# Patient Record
Sex: Female | Born: 1950 | Race: Asian | Hispanic: No | Smoking: Never smoker
Health system: Southern US, Community
[De-identification: ages and names within clinical notes are randomized; demographics above are authoritative.]

## PROBLEM LIST (undated history)

## (undated) DIAGNOSIS — C801 Malignant (primary) neoplasm, unspecified: Secondary | ICD-10-CM

## (undated) DIAGNOSIS — Z87442 Personal history of urinary calculi: Secondary | ICD-10-CM

## (undated) DIAGNOSIS — Z973 Presence of spectacles and contact lenses: Secondary | ICD-10-CM

## (undated) DIAGNOSIS — C50919 Malignant neoplasm of unspecified site of unspecified female breast: Secondary | ICD-10-CM

## (undated) DIAGNOSIS — K5909 Other constipation: Secondary | ICD-10-CM

## (undated) DIAGNOSIS — N201 Calculus of ureter: Secondary | ICD-10-CM

## (undated) DIAGNOSIS — E785 Hyperlipidemia, unspecified: Secondary | ICD-10-CM

## (undated) DIAGNOSIS — Z8619 Personal history of other infectious and parasitic diseases: Secondary | ICD-10-CM

## (undated) DIAGNOSIS — K7460 Unspecified cirrhosis of liver: Secondary | ICD-10-CM

## (undated) DIAGNOSIS — I10 Essential (primary) hypertension: Secondary | ICD-10-CM

## (undated) HISTORY — DX: Hyperlipidemia, unspecified: E78.5

## (undated) HISTORY — DX: Essential (primary) hypertension: I10

## (undated) HISTORY — PX: PARTIAL HYSTERECTOMY: SHX80

## (undated) HISTORY — DX: Other constipation: K59.09

---

## 2004-12-28 ENCOUNTER — Ambulatory Visit: Payer: Self-pay | Admitting: Internal Medicine

## 2005-02-04 ENCOUNTER — Ambulatory Visit: Payer: Self-pay | Admitting: *Deleted

## 2005-03-19 ENCOUNTER — Ambulatory Visit: Payer: Self-pay | Admitting: Internal Medicine

## 2005-05-24 ENCOUNTER — Ambulatory Visit: Payer: Self-pay | Admitting: Internal Medicine

## 2017-03-28 ENCOUNTER — Emergency Department (HOSPITAL_COMMUNITY)
Admission: EM | Admit: 2017-03-28 | Discharge: 2017-03-28 | Disposition: A | Discharge status: Home / Self Care | Payer: Self-pay | Attending: Emergency Medicine | Admitting: Emergency Medicine

## 2017-03-28 ENCOUNTER — Encounter (HOSPITAL_COMMUNITY): Payer: Self-pay | Admitting: Nurse Practitioner

## 2017-03-28 ENCOUNTER — Emergency Department (HOSPITAL_COMMUNITY): Payer: Self-pay

## 2017-03-28 DIAGNOSIS — N23 Unspecified renal colic: Secondary | ICD-10-CM | POA: Insufficient documentation

## 2017-03-28 DIAGNOSIS — N201 Calculus of ureter: Secondary | ICD-10-CM | POA: Insufficient documentation

## 2017-03-28 DIAGNOSIS — Z79899 Other long term (current) drug therapy: Secondary | ICD-10-CM | POA: Insufficient documentation

## 2017-03-28 LAB — URINALYSIS, ROUTINE W REFLEX MICROSCOPIC
Bilirubin Urine: NEGATIVE
Glucose, UA: NEGATIVE mg/dL
Ketones, ur: NEGATIVE mg/dL
NITRITE: NEGATIVE
PH: 7 (ref 5.0–8.0)
Protein, ur: NEGATIVE mg/dL
SPECIFIC GRAVITY, URINE: 1.012 (ref 1.005–1.030)

## 2017-03-28 LAB — COMPREHENSIVE METABOLIC PANEL
ALT: 18 U/L (ref 14–54)
AST: 27 U/L (ref 15–41)
Albumin: 3.8 g/dL (ref 3.5–5.0)
Alkaline Phosphatase: 70 U/L (ref 38–126)
Anion gap: 8 (ref 5–15)
BILIRUBIN TOTAL: 1.1 mg/dL (ref 0.3–1.2)
BUN: 14 mg/dL (ref 6–20)
CALCIUM: 9 mg/dL (ref 8.9–10.3)
CO2: 25 mmol/L (ref 22–32)
CREATININE: 0.97 mg/dL (ref 0.44–1.00)
Chloride: 107 mmol/L (ref 101–111)
Glucose, Bld: 123 mg/dL — ABNORMAL HIGH (ref 65–99)
Potassium: 3.1 mmol/L — ABNORMAL LOW (ref 3.5–5.1)
Sodium: 140 mmol/L (ref 135–145)
TOTAL PROTEIN: 7.1 g/dL (ref 6.5–8.1)

## 2017-03-28 LAB — CBC
HCT: 39.4 % (ref 36.0–46.0)
Hemoglobin: 13 g/dL (ref 12.0–15.0)
MCH: 27.5 pg (ref 26.0–34.0)
MCHC: 33 g/dL (ref 30.0–36.0)
MCV: 83.5 fL (ref 78.0–100.0)
PLATELETS: 162 10*3/uL (ref 150–400)
RBC: 4.72 MIL/uL (ref 3.87–5.11)
RDW: 13.1 % (ref 11.5–15.5)
WBC: 9.9 10*3/uL (ref 4.0–10.5)

## 2017-03-28 MED ORDER — ONDANSETRON HCL 4 MG/2ML IJ SOLN: 4.0000 mg | Freq: Once | INTRAMUSCULAR | Status: DC

## 2017-03-28 MED ORDER — POTASSIUM CHLORIDE CRYS ER 20 MEQ PO TBCR
40.0000 meq | EXTENDED_RELEASE_TABLET | Freq: Once | ORAL | Status: AC
  Administered 2017-03-28: 40 meq via ORAL
  Filled 2017-03-28: qty 2

## 2017-03-28 MED ORDER — OXYCODONE-ACETAMINOPHEN 5-325 MG PO TABS
2.0000 | ORAL_TABLET | Freq: Once | ORAL | Status: AC
  Administered 2017-03-28: 2 via ORAL
  Filled 2017-03-28: qty 2

## 2017-03-28 MED ORDER — KETOROLAC TROMETHAMINE 60 MG/2ML IM SOLN
30.0000 mg | Freq: Once | INTRAMUSCULAR | Status: AC
  Administered 2017-03-28: 30 mg via INTRAMUSCULAR
  Filled 2017-03-28: qty 2

## 2017-03-28 MED ORDER — ONDANSETRON 8 MG PO TBDP
8.0000 mg | ORAL_TABLET | Freq: Once | ORAL | Status: AC
  Administered 2017-03-28: 8 mg via ORAL
  Filled 2017-03-28: qty 1

## 2017-03-28 MED ORDER — PROMETHAZINE HCL 25 MG PO TABS: 25.0000 mg | ORAL_TABLET | Freq: Four times a day (QID) | ORAL | 0 refills | Status: DC | PRN

## 2017-03-28 MED ORDER — TAMSULOSIN HCL 0.4 MG PO CAPS: 0.4000 mg | ORAL_CAPSULE | Freq: Every day | ORAL | 0 refills | Status: DC

## 2017-03-28 MED ORDER — OXYCODONE-ACETAMINOPHEN 5-325 MG PO TABS: 1.0000 | ORAL_TABLET | Freq: Four times a day (QID) | ORAL | 0 refills | Status: DC | PRN

## 2017-03-28 MED ORDER — SODIUM CHLORIDE 0.9 % IV BOLUS (SEPSIS): 1000.0000 mL | Freq: Once | INTRAVENOUS | Status: DC

## 2017-03-28 MED ORDER — IBUPROFEN 600 MG PO TABS: 600.0000 mg | ORAL_TABLET | Freq: Four times a day (QID) | ORAL | 0 refills | Status: DC | PRN

## 2017-03-28 NOTE — Discharge Instructions (Signed)
We recommend that you take Flomax as prescribed to promote stone movement. You may take ibuprofen for pain and Phenergan for nausea/vomiting. For severe pain, you may take Percocet as prescribed. Follow-up with a urologist to ensure complete passage of your kidney stone. You may return for new or concerning symptoms.

## 2017-03-28 NOTE — ED Provider Notes (Signed)
[facility] DEPT Provider Note   CSN: [id number] Arrival date & time: 03/28/17  0002 By signing my name below, I, Fabian Sharp, attest that this documentation has been prepared under the direction and in the presence of non-physician provider, Antonietta Breach, PA-C. Electronically Signed: Fabian Sharp, ED Scribe. 03/27/2017. 1:20 AM.   History   Chief Complaint Chief Complaint  Patient presents with  . Flank Pain  . Probable Kidney Stone  . Dysuria   HPI Wendy Walsh is a 66 y.o. female who presents to the Emergency Department complaining of constant left sided flank pain that radiates to her LLQ onset today at 3 pm. She reports associated chills, nausea and vomiting 1x. Pt reports that pain is similar to prior kidney stone.  No treatments tried to alleviate pain. She reports SHx hysterectomy. Pt denies hematuria, dysuria, and fever.  The history is provided by a relative and the patient. A language interpreter was used.   Past Medical History:  Diagnosis Date  . Kidney stones     There are no active problems to display for this patient.   History reviewed. No pertinent surgical history.  OB History    No data available       Home Medications    Prior to Admission medications   Medication Sig Start Date End Date Taking? Authorizing Provider  amLODipine (NORVASC) 5 MG tablet Take 5 mg by mouth daily.   Yes [provider]  ibuprofen (ADVIL,MOTRIN) 600 MG tablet Take 1 tablet (600 mg total) by mouth every 6 (six) hours as needed. 03/28/17   Antonietta Breach, PA-C  oxyCODONE-acetaminophen (PERCOCET/ROXICET) 5-325 MG tablet Take 1-2 tablets by mouth every 6 (six) hours as needed for severe pain. 03/28/17   Antonietta Breach, PA-C  promethazine (PHENERGAN) 25 MG tablet Take 1 tablet (25 mg total) by mouth every 6 (six) hours as needed for nausea or vomiting. 03/28/17   Antonietta Breach, PA-C  tamsulosin (FLOMAX) 0.4 MG CAPS capsule Take 1 capsule (0.4 mg total) by mouth daily.  03/28/17   Antonietta Breach, PA-C    Family History History reviewed. No pertinent family history.  Social History Social History  Substance Use Topics  . Smoking status: Never Smoker  . Smokeless tobacco: Never Used  . Alcohol use No     Allergies   Patient has no known allergies.   Review of Systems Review of Systems All systems reviewed and are negative for acute change except as noted in the HPI.  Physical Exam Updated Vital Signs BP 124/72 (BP Location: Right Arm)   Pulse 68   Temp 99 F (37.2 C) (Oral)   Resp 18   SpO2 100%   Physical Exam  Constitutional: She is oriented to person, place, and time. She appears well-developed and well-nourished. No distress.  Nontoxic appearing. Calm, pleasant.  HENT:  Head: Normocephalic and atraumatic.  Eyes: Conjunctivae and EOM are normal. No scleral icterus.  Neck: Normal range of motion.  Cardiovascular: Normal rate, regular rhythm and intact distal pulses.   Pulmonary/Chest: Effort normal. No respiratory distress. She has no wheezes. She has no rales.  Respirations even and unlabored. Lungs CTAB.  Abdominal: Soft. She exhibits no mass. There is tenderness. There is no guarding.  Soft, nondistended abdomen with TTP in the L mid and lower abdomen. No peritoneal signs.  Musculoskeletal: Normal range of motion.  Neurological: She is alert and oriented to person, place, and time. She exhibits normal muscle tone. Coordination normal.  Ambulatory with steady gait.  Skin: Skin is warm and dry. No rash noted. She is not diaphoretic. No erythema. No pallor.  Psychiatric: She has a normal mood and affect. Her behavior is normal.  Nursing note and vitals reviewed.   ED Treatments / Results  DIAGNOSTIC STUDIES:  Oxygen Saturation is 100% on RA, normal by my interpretation.    COORDINATION OF CARE:  1:15 AM Pt declined pain medication. Will order CT renal stone study. Discussed treatment plan with pt at bedside and pt agreed to  plan.  Labs (all labs ordered are listed, but only abnormal results are displayed) Labs Reviewed  COMPREHENSIVE METABOLIC PANEL - Abnormal; Notable for the following:       Result Value   Potassium 3.1 (*)    Glucose, Bld 123 (*)    All other components within normal limits  URINALYSIS, ROUTINE W REFLEX MICROSCOPIC - Abnormal; Notable for the following:    Hgb urine dipstick SMALL (*)    Leukocytes, UA MODERATE (*)    Bacteria, UA RARE (*)    Squamous Epithelial / LPF 0-5 (*)    All other components within normal limits  CBC    EKG  EKG Interpretation None       Radiology Ct Renal Stone Study  Result Date: 03/28/2017 CLINICAL DATA:  Acute onset of left flank pain, radiating to the left lower quadrant. Chills, nausea and vomiting. Red blood cells and white blood cells in the urine. Initial encounter. EXAM: CT ABDOMEN AND PELVIS WITHOUT CONTRAST TECHNIQUE: Multidetector CT imaging of the abdomen and pelvis was performed following the standard protocol without IV contrast. COMPARISON:  None. FINDINGS: Lower chest: Minimal bibasilar atelectasis is noted. The visualized portions of the mediastinum are unremarkable. Hepatobiliary: The liver is unremarkable in appearance. The gallbladder is unremarkable in appearance. The common bile duct remains normal in caliber. Pancreas: The pancreas is within normal limits. Spleen: The spleen is unremarkable in appearance. Adrenals/Urinary Tract: The adrenal glands are unremarkable in appearance. There is minimal left-sided hydronephrosis, with prominence of the proximal left ureter. An obstructing 6 x 3 mm stone is noted at the left mid ureter. Left-sided perinephric stranding is noted. The right kidney is unremarkable in appearance. Stomach/Bowel: The stomach is unremarkable in appearance. The small bowel is within normal limits. The appendix is normal in caliber, without evidence of appendicitis. The colon is unremarkable in appearance.  Vascular/Lymphatic: Minimal calcification is seen in the mid abdominal aorta. No retroperitoneal or pelvic sidewall lymphadenopathy is seen. Reproductive: The bladder is mildly distended and grossly unremarkable in appearance. The patient is status post hysterectomy. No suspicious adnexal masses are seen. Other: No additional soft tissue abnormalities are seen. Musculoskeletal: No acute osseous abnormalities are identified. The visualized musculature is unremarkable in appearance. IMPRESSION: Minimal left-sided hydronephrosis, with an obstructing 6 x 3 mm stone at the left mid ureter. Electronically Signed   By: Garald Balding M.D.   On: 03/28/2017 02:39    Procedures Procedures (including critical care time)  Medications Ordered in ED Medications  ondansetron (ZOFRAN-ODT) disintegrating tablet 8 mg (not administered)  ketorolac (TORADOL) injection 30 mg (not administered)  oxyCODONE-acetaminophen (PERCOCET/ROXICET) 5-325 MG per tablet 2 tablet (not administered)     Initial Impression / Assessment and Plan / ED Course  I have reviewed the triage vital signs and the nursing notes.  Pertinent labs & imaging results that were available during my care of the patient were reviewed by me and considered in my medical decision making (see chart for details).  Pt has been diagnosed with a kidney stone via CT. There is no evidence of significant hydronephrosis, serum creatine WNL, vitals sign stable and the pt does not have irratractable vomiting. Patient will be discharged home with pain medications and has been advised to follow up with a urologist. Return precautions discussed and provided. Patient discharged in stable condition with no unaddressed concerns.   Final Clinical Impressions(s) / ED Diagnoses   Final diagnoses:  Ureterolithiasis  Renal colic on left side    New Prescriptions New Prescriptions   IBUPROFEN (ADVIL,MOTRIN) 600 MG TABLET    Take 1 tablet (600 mg total) by mouth  every 6 (six) hours as needed.   OXYCODONE-ACETAMINOPHEN (PERCOCET/ROXICET) 5-325 MG TABLET    Take 1-2 tablets by mouth every 6 (six) hours as needed for severe pain.   PROMETHAZINE (PHENERGAN) 25 MG TABLET    Take 1 tablet (25 mg total) by mouth every 6 (six) hours as needed for nausea or vomiting.   TAMSULOSIN (FLOMAX) 0.4 MG CAPS CAPSULE    Take 1 capsule (0.4 mg total) by mouth daily.    I personally performed the services described in this documentation, which was scribed in my presence. The recorded information has been reviewed and is accurate.       Antonietta Breach, PA-C 03/28/17 0017    Randal Buba, April, MD 03/28/17 [id number]

## 2017-03-28 NOTE — ED Triage Notes (Signed)
Pt is c/o severe bilateral flank pain. Reports sudden onset, and episode of emesis and chills. Reports hx of kidney stones too.

## 2017-03-30 ENCOUNTER — Emergency Department (HOSPITAL_COMMUNITY): Payer: Self-pay | Admitting: Anesthesiology

## 2017-03-30 ENCOUNTER — Emergency Department (HOSPITAL_COMMUNITY): Payer: Self-pay

## 2017-03-30 ENCOUNTER — Encounter (HOSPITAL_COMMUNITY): Payer: Self-pay | Admitting: *Deleted

## 2017-03-30 ENCOUNTER — Inpatient Hospital Stay (HOSPITAL_COMMUNITY)
Admission: EM | Admit: 2017-03-30 | Discharge: 2017-04-03 | DRG: 871 | Disposition: A | Discharge status: Home / Self Care | Payer: Self-pay | Attending: Internal Medicine | Admitting: Internal Medicine

## 2017-03-30 ENCOUNTER — Encounter (HOSPITAL_COMMUNITY)
Admission: EM | Disposition: A | Discharge status: Home / Self Care | Payer: Self-pay | Source: Home / Self Care | Attending: Pulmonary Disease

## 2017-03-30 DIAGNOSIS — I10 Essential (primary) hypertension: Secondary | ICD-10-CM | POA: Diagnosis present

## 2017-03-30 DIAGNOSIS — A419 Sepsis, unspecified organism: Principal | ICD-10-CM | POA: Diagnosis present

## 2017-03-30 DIAGNOSIS — E860 Dehydration: Secondary | ICD-10-CM

## 2017-03-30 DIAGNOSIS — Z79899 Other long term (current) drug therapy: Secondary | ICD-10-CM

## 2017-03-30 DIAGNOSIS — N136 Pyonephrosis: Secondary | ICD-10-CM | POA: Diagnosis present

## 2017-03-30 DIAGNOSIS — N3090 Cystitis, unspecified without hematuria: Secondary | ICD-10-CM

## 2017-03-30 DIAGNOSIS — N132 Hydronephrosis with renal and ureteral calculous obstruction: Secondary | ICD-10-CM | POA: Diagnosis present

## 2017-03-30 DIAGNOSIS — Z87442 Personal history of urinary calculi: Secondary | ICD-10-CM

## 2017-03-30 DIAGNOSIS — R197 Diarrhea, unspecified: Secondary | ICD-10-CM | POA: Diagnosis present

## 2017-03-30 DIAGNOSIS — Z79891 Long term (current) use of opiate analgesic: Secondary | ICD-10-CM

## 2017-03-30 DIAGNOSIS — N390 Urinary tract infection, site not specified: Secondary | ICD-10-CM | POA: Diagnosis present

## 2017-03-30 DIAGNOSIS — R7881 Bacteremia: Secondary | ICD-10-CM

## 2017-03-30 DIAGNOSIS — D696 Thrombocytopenia, unspecified: Secondary | ICD-10-CM | POA: Diagnosis present

## 2017-03-30 DIAGNOSIS — N179 Acute kidney failure, unspecified: Secondary | ICD-10-CM | POA: Diagnosis present

## 2017-03-30 DIAGNOSIS — I248 Other forms of acute ischemic heart disease: Secondary | ICD-10-CM | POA: Diagnosis present

## 2017-03-30 DIAGNOSIS — R6521 Severe sepsis with septic shock: Secondary | ICD-10-CM | POA: Diagnosis present

## 2017-03-30 DIAGNOSIS — B9620 Unspecified Escherichia coli [E. coli] as the cause of diseases classified elsewhere: Secondary | ICD-10-CM | POA: Diagnosis present

## 2017-03-30 DIAGNOSIS — N201 Calculus of ureter: Secondary | ICD-10-CM

## 2017-03-30 DIAGNOSIS — R748 Abnormal levels of other serum enzymes: Secondary | ICD-10-CM

## 2017-03-30 HISTORY — DX: Essential (primary) hypertension: I10

## 2017-03-30 HISTORY — PX: CYSTOSCOPY WITH STENT PLACEMENT: SHX5790

## 2017-03-30 LAB — CBC WITH DIFFERENTIAL/PLATELET
Basophils Absolute: 0 10*3/uL (ref 0.0–0.1)
Basophils Relative: 0 %
EOS ABS: 0 10*3/uL (ref 0.0–0.7)
EOS PCT: 1 %
HCT: 37.8 % (ref 36.0–46.0)
Hemoglobin: 12.1 g/dL (ref 12.0–15.0)
LYMPHS ABS: 0.1 10*3/uL — AB (ref 0.7–4.0)
Lymphocytes Relative: 3 %
MCH: 26.9 pg (ref 26.0–34.0)
MCHC: 32 g/dL (ref 30.0–36.0)
MCV: 84.2 fL (ref 78.0–100.0)
MONO ABS: 0.1 10*3/uL (ref 0.1–1.0)
Monocytes Relative: 2 %
Neutro Abs: 3.1 10*3/uL (ref 1.7–7.7)
Neutrophils Relative %: 94 %
PLATELETS: 108 10*3/uL — AB (ref 150–400)
RBC: 4.49 MIL/uL (ref 3.87–5.11)
RDW: 13.7 % (ref 11.5–15.5)
WBC: 3.3 10*3/uL — AB (ref 4.0–10.5)

## 2017-03-30 LAB — I-STAT CG4 LACTIC ACID, ED
LACTIC ACID, VENOUS: 2.91 mmol/L — AB (ref 0.5–1.9)
Lactic Acid, Venous: 4.8 mmol/L (ref 0.5–1.9)

## 2017-03-30 LAB — URINALYSIS, ROUTINE W REFLEX MICROSCOPIC
Bilirubin Urine: NEGATIVE
Glucose, UA: NEGATIVE mg/dL
Ketones, ur: NEGATIVE mg/dL
Nitrite: POSITIVE — AB
PROTEIN: 100 mg/dL — AB
SPECIFIC GRAVITY, URINE: 1.013 (ref 1.005–1.030)
pH: 5 (ref 5.0–8.0)

## 2017-03-30 LAB — COMPREHENSIVE METABOLIC PANEL
ALK PHOS: 113 U/L (ref 38–126)
ALT: 26 U/L (ref 14–54)
AST: 45 U/L — AB (ref 15–41)
Albumin: 3.1 g/dL — ABNORMAL LOW (ref 3.5–5.0)
Anion gap: 11 (ref 5–15)
BILIRUBIN TOTAL: 1.4 mg/dL — AB (ref 0.3–1.2)
BUN: 32 mg/dL — AB (ref 6–20)
CALCIUM: 8.2 mg/dL — AB (ref 8.9–10.3)
CHLORIDE: 108 mmol/L (ref 101–111)
CO2: 19 mmol/L — ABNORMAL LOW (ref 22–32)
CREATININE: 3 mg/dL — AB (ref 0.44–1.00)
GFR, EST AFRICAN AMERICAN: 18 mL/min — AB (ref >60)
GFR, EST NON AFRICAN AMERICAN: 15 mL/min — AB (ref >60)
Glucose, Bld: 160 mg/dL — ABNORMAL HIGH (ref 65–99)
Potassium: 4.3 mmol/L (ref 3.5–5.1)
Sodium: 138 mmol/L (ref 135–145)
Total Protein: 6.5 g/dL (ref 6.5–8.1)

## 2017-03-30 LAB — LACTIC ACID, PLASMA: Lactic Acid, Venous: 3.5 mmol/L (ref 0.5–1.9)

## 2017-03-30 LAB — TROPONIN I: TROPONIN I: 0.61 ng/mL — AB (ref <0.03)

## 2017-03-30 LAB — PROCALCITONIN: Procalcitonin: 49.36 ng/mL

## 2017-03-30 LAB — MRSA PCR SCREENING: MRSA by PCR: NEGATIVE

## 2017-03-30 LAB — CORTISOL: Cortisol, Plasma: 20.5 ug/dL

## 2017-03-30 SURGERY — CYSTOSCOPY, WITH STENT INSERTION
Anesthesia: General | Site: Ureter | Laterality: Left

## 2017-03-30 MED ORDER — FENTANYL CITRATE (PF) 100 MCG/2ML IJ SOLN
INTRAMUSCULAR | Status: AC
  Filled 2017-03-30: qty 2

## 2017-03-30 MED ORDER — PHENYLEPHRINE HCL-NACL 10-0.9 MG/250ML-% IV SOLN
INTRAVENOUS | Status: AC
  Administered 2017-03-30: 20 ug/min via INTRAVENOUS
  Filled 2017-03-30: qty 250

## 2017-03-30 MED ORDER — SODIUM CHLORIDE 0.9 % IV BOLUS (SEPSIS)
1000.0000 mL | Freq: Once | INTRAVENOUS | Status: AC
  Administered 2017-03-30: 1000 mL via INTRAVENOUS

## 2017-03-30 MED ORDER — LACTATED RINGERS IV SOLN
INTRAVENOUS | Status: DC | PRN
  Administered 2017-03-30 (×2): via INTRAVENOUS

## 2017-03-30 MED ORDER — MIDAZOLAM HCL 5 MG/5ML IJ SOLN
INTRAMUSCULAR | Status: DC | PRN
  Administered 2017-03-30 (×2): 1 mg via INTRAVENOUS

## 2017-03-30 MED ORDER — MIDAZOLAM HCL 2 MG/2ML IJ SOLN
INTRAMUSCULAR | Status: AC
  Filled 2017-03-30: qty 2

## 2017-03-30 MED ORDER — DEXTROSE 5 % IV SOLN: 1.0000 g | INTRAVENOUS | Status: DC

## 2017-03-30 MED ORDER — VANCOMYCIN HCL IN DEXTROSE 1-5 GM/200ML-% IV SOLN
1000.0000 mg | Freq: Once | INTRAVENOUS | Status: AC
  Administered 2017-03-30: 1000 mg via INTRAVENOUS
  Filled 2017-03-30: qty 200

## 2017-03-30 MED ORDER — PHENYLEPHRINE HCL 10 MG/ML IJ SOLN
INTRAVENOUS | Status: DC | PRN
  Administered 2017-03-30: 20 ug/min via INTRAVENOUS

## 2017-03-30 MED ORDER — ONDANSETRON HCL 4 MG/2ML IJ SOLN
INTRAMUSCULAR | Status: AC
  Filled 2017-03-30: qty 2

## 2017-03-30 MED ORDER — PROPOFOL 10 MG/ML IV BOLUS
INTRAVENOUS | Status: DC | PRN
  Administered 2017-03-30: 100 mg via INTRAVENOUS

## 2017-03-30 MED ORDER — VANCOMYCIN HCL IN DEXTROSE 1-5 GM/200ML-% IV SOLN: 1000.0000 mg | INTRAVENOUS | Status: DC

## 2017-03-30 MED ORDER — CEFTRIAXONE SODIUM 1 G IJ SOLR: 1.0000 g | INTRAMUSCULAR | Status: DC

## 2017-03-30 MED ORDER — LIDOCAINE 2% (20 MG/ML) 5 ML SYRINGE
INTRAMUSCULAR | Status: DC | PRN
  Administered 2017-03-30: 50 mg via INTRAVENOUS

## 2017-03-30 MED ORDER — SODIUM CHLORIDE 0.9 % IV BOLUS (SEPSIS): 500.0000 mL | Freq: Once | INTRAVENOUS | Status: AC

## 2017-03-30 MED ORDER — FENTANYL CITRATE (PF) 100 MCG/2ML IJ SOLN
INTRAMUSCULAR | Status: DC | PRN
  Administered 2017-03-30: 50 ug via INTRAVENOUS
  Administered 2017-03-30 (×2): 25 ug via INTRAVENOUS

## 2017-03-30 MED ORDER — LACTATED RINGERS IV SOLN: INTRAVENOUS | Status: DC

## 2017-03-30 MED ORDER — DEXTROSE 5 % IV SOLN
2.0000 g | Freq: Once | INTRAVENOUS | Status: AC
  Administered 2017-03-30: 2 g via INTRAVENOUS
  Filled 2017-03-30: qty 2

## 2017-03-30 MED ORDER — PROPOFOL 10 MG/ML IV BOLUS
INTRAVENOUS | Status: AC
  Filled 2017-03-30: qty 20

## 2017-03-30 MED ORDER — SODIUM CHLORIDE 0.9 % IV SOLN: 250.0000 mL | INTRAVENOUS | Status: DC | PRN

## 2017-03-30 MED ORDER — SODIUM CHLORIDE 0.9 % IR SOLN
Status: DC | PRN
  Administered 2017-03-30: 3000 mL

## 2017-03-30 MED ORDER — FENTANYL CITRATE (PF) 100 MCG/2ML IJ SOLN: 25.0000 ug | INTRAMUSCULAR | Status: DC | PRN

## 2017-03-30 MED ORDER — METOCLOPRAMIDE HCL 5 MG/ML IJ SOLN: 10.0000 mg | Freq: Once | INTRAMUSCULAR | Status: DC | PRN

## 2017-03-30 MED ORDER — MEPERIDINE HCL 50 MG/ML IJ SOLN: 6.2500 mg | INTRAMUSCULAR | Status: DC | PRN

## 2017-03-30 MED ORDER — ONDANSETRON HCL 4 MG/2ML IJ SOLN
INTRAMUSCULAR | Status: DC | PRN
  Administered 2017-03-30: 4 mg via INTRAVENOUS

## 2017-03-30 MED ORDER — ONDANSETRON HCL 4 MG/2ML IJ SOLN: 4.0000 mg | Freq: Four times a day (QID) | INTRAMUSCULAR | Status: DC | PRN

## 2017-03-30 MED ORDER — SUCCINYLCHOLINE CHLORIDE 200 MG/10ML IV SOSY
PREFILLED_SYRINGE | INTRAVENOUS | Status: DC | PRN
  Administered 2017-03-30: 100 mg via INTRAVENOUS

## 2017-03-30 MED ORDER — PHENYLEPHRINE HCL-NACL 10-0.9 MG/250ML-% IV SOLN
0.0000 ug/min | INTRAVENOUS | Status: DC
  Administered 2017-03-30: 20 ug/min via INTRAVENOUS
  Administered 2017-03-31: 17 ug/min via INTRAVENOUS
  Administered 2017-03-31: 20 ug/min via INTRAVENOUS
  Filled 2017-03-30 (×2): qty 250

## 2017-03-30 MED ORDER — TAMSULOSIN HCL 0.4 MG PO CAPS
0.4000 mg | ORAL_CAPSULE | Freq: Every day | ORAL | Status: DC
  Administered 2017-03-31 – 2017-04-03 (×4): 0.4 mg via ORAL
  Filled 2017-03-30 (×4): qty 1

## 2017-03-30 MED ORDER — IOHEXOL 300 MG/ML  SOLN
INTRAMUSCULAR | Status: DC | PRN
  Administered 2017-03-30: 5 mL via URETHRAL

## 2017-03-30 MED ORDER — HEPARIN SODIUM (PORCINE) 5000 UNIT/ML IJ SOLN
5000.0000 [IU] | Freq: Three times a day (TID) | INTRAMUSCULAR | Status: DC
  Administered 2017-03-31 – 2017-04-03 (×10): 5000 [IU] via SUBCUTANEOUS
  Filled 2017-03-30 (×10): qty 1

## 2017-03-30 SURGICAL SUPPLY — 19 items
BAG URINE DRAINAGE (UROLOGICAL SUPPLIES) ×3 IMPLANT
BAG URO CATCHER STRL LF (MISCELLANEOUS) ×3 IMPLANT
BASKET ZERO TIP NITINOL 2.4FR (BASKET) IMPLANT
CATH FOLEY 2WAY SLVR  5CC 16FR (CATHETERS) ×2
CATH FOLEY 2WAY SLVR 5CC 16FR (CATHETERS) ×1 IMPLANT
CATH INTERMIT  6FR 70CM (CATHETERS) ×3 IMPLANT
CLOTH BEACON ORANGE TIMEOUT ST (SAFETY) ×3 IMPLANT
COVER SURGICAL LIGHT HANDLE (MISCELLANEOUS) ×3 IMPLANT
GLOVE BIOGEL M STRL SZ7.5 (GLOVE) ×3 IMPLANT
GOWN STRL REUS W/TWL LRG LVL3 (GOWN DISPOSABLE) ×3 IMPLANT
GOWN STRL REUS W/TWL XL LVL3 (GOWN DISPOSABLE) ×3 IMPLANT
GUIDEWIRE ANG ZIPWIRE 038X150 (WIRE) IMPLANT
GUIDEWIRE STR DUAL SENSOR (WIRE) ×3 IMPLANT
MANIFOLD NEPTUNE II (INSTRUMENTS) ×3 IMPLANT
PACK CYSTO (CUSTOM PROCEDURE TRAY) ×3 IMPLANT
STENT URET 6FRX24 CONTOUR (STENTS) ×3 IMPLANT
SYR 10ML LL (SYRINGE) ×3 IMPLANT
TUBING CONNECTING 10 (TUBING) ×2 IMPLANT
TUBING CONNECTING 10' (TUBING) ×1

## 2017-03-30 NOTE — ED Notes (Signed)
Informed consent placed at bedside. Patient wiped down with chlorhexidine wipes.

## 2017-03-30 NOTE — Transfer of Care (Signed)
Immediate Anesthesia Transfer of Care Note  Patient: Wendy Walsh  Procedure(s) Performed: Procedure(s): CYSTOSCOPY WITH STENT PLACEMENT AND RETROGRADE (Left)  Patient Location: PACU  Anesthesia Type:General  Level of Consciousness: awake, alert  and oriented  Airway & Oxygen Therapy: Patient Spontanous Breathing and Patient connected to face mask oxygen  Post-op Assessment: Report given to RN and Post -op Vital signs reviewed and stable  Post vital signs: Reviewed and stable  Last Vitals:  Vitals:   03/30/17 1942 03/30/17 1945  BP: (!) 70/54 (!) 77/59  Pulse:  (!) 105  Resp:  20  Temp:      Last Pain:  Vitals:   03/30/17 1430  TempSrc: Oral         Complications: No apparent anesthesia complications

## 2017-03-30 NOTE — Consult Note (Addendum)
Consult: left flank pain, sepsis Requested by: Dr. Ashok Cordia  History of Present Illness: 66 year old female who presented to the emergency department 2 days ago with left flank pain and left lower quadrant pain. CT scan revealed a 5.5 x 3 mm left proximal ureteral stone. He had normal labs and UA with only rare bacteria and negative nitrite. A few white cells were in the urine. She was observed but represented today with continued left flank pain and septic shock. KUB is equivocal but I do believe he still has some left hydronephrosis. I spoke with radiology - she does have moderate hydro, but also a R>L jet.   Pt pain is better. Daughter and Aunt interpreted. She has not seen a stone pass. She has a h/o stone 10 yrs ago, but passed it.    The patient just voided in a bed pain and I asked the tech to check the urine. She did not see a stone.   Past Medical History:  Diagnosis Date  . Hypertension   . Kidney stones    Past Surgical History:  Procedure Laterality Date  . ABDOMINAL HYSTERECTOMY      Home Medications:   (Not in a hospital admission) Allergies: No Known Allergies  History reviewed. No pertinent family history. Social History:  reports that she has never smoked. She has never used smokeless tobacco. She reports that she does not drink alcohol or use drugs.  ROS: A complete review of systems was performed.  All systems are negative except for pertinent findings as noted. Review of Systems  All other systems reviewed and are negative.    Physical Exam:  Vital signs in last 24 hours: Temp:  [102.4 F (39.1 C)] 102.4 F (39.1 C) (05/30 1430) Pulse Rate:  [94-105] 105 (05/30 1645) Resp:  [18-25] 24 (05/30 1645) BP: (83-104)/(41-59) 104/57 (05/30 1630) SpO2:  [87 %-94 %] 87 % (05/30 1645) Weight:  [63.5 kg (140 lb)] 63.5 kg (140 lb) (05/30 1427) General:  Alert and oriented, No acute distress HEENT: Normocephalic, atraumatic Neck: No JVD or  lymphadenopathy Cardiovascular: Regular rate and rhythm Lungs: Regular rate and effort Abdomen: Soft, nontender, nondistended, no abdominal masses Back: No CVA tenderness Extremities: No edema Neurologic: Grossly intact  Laboratory Data:  Results for orders placed or performed during the hospital encounter of 03/30/17 (from the past 24 hour(s))  Comprehensive metabolic panel     Status: Abnormal   Collection Time: 03/30/17  2:46 PM  Result Value Ref Range   Sodium 138 135 - 145 mmol/L   Potassium 4.3 3.5 - 5.1 mmol/L   Chloride 108 101 - 111 mmol/L   CO2 19 (L) 22 - 32 mmol/L   Glucose, Bld 160 (H) 65 - 99 mg/dL   BUN 32 (H) 6 - 20 mg/dL   Creatinine, Ser 3.00 (H) 0.44 - 1.00 mg/dL   Calcium 8.2 (L) 8.9 - 10.3 mg/dL   Total Protein 6.5 6.5 - 8.1 g/dL   Albumin 3.1 (L) 3.5 - 5.0 g/dL   AST 45 (H) 15 - 41 U/L   ALT 26 14 - 54 U/L   Alkaline Phosphatase 113 38 - 126 U/L   Total Bilirubin 1.4 (H) 0.3 - 1.2 mg/dL   GFR calc non Af Amer 15 (L) >60 mL/min   GFR calc Af Amer 18 (L) >60 mL/min   Anion gap 11 5 - 15  CBC WITH DIFFERENTIAL     Status: Abnormal   Collection Time: 03/30/17  2:46 PM  Result  Value Ref Range   WBC 3.3 (L) 4.0 - 10.5 K/uL   RBC 4.49 3.87 - 5.11 MIL/uL   Hemoglobin 12.1 12.0 - 15.0 g/dL   HCT 37.8 36.0 - 46.0 %   MCV 84.2 78.0 - 100.0 fL   MCH 26.9 26.0 - 34.0 pg   MCHC 32.0 30.0 - 36.0 g/dL   RDW 13.7 11.5 - 15.5 %   Platelets 108 (L) 150 - 400 K/uL   Neutrophils Relative % 94 %   Lymphocytes Relative 3 %   Monocytes Relative 2 %   Eosinophils Relative 1 %   Basophils Relative 0 %   Neutro Abs 3.1 1.7 - 7.7 K/uL   Lymphs Abs 0.1 (L) 0.7 - 4.0 K/uL   Monocytes Absolute 0.1 0.1 - 1.0 K/uL   Eosinophils Absolute 0.0 0.0 - 0.7 K/uL   Basophils Absolute 0.0 0.0 - 0.1 K/uL   WBC Morphology VACUOLATED NEUTROPHILS   I-Stat CG4 Lactic Acid, ED  (not at  [facility])     Status: Abnormal   Collection Time: 03/30/17  2:56 PM  Result Value Ref Range   Lactic  Acid, Venous 4.80 (HH) 0.5 - 1.9 mmol/L   Comment NOTIFIED PHYSICIAN   Urinalysis, Routine w reflex microscopic     Status: Abnormal   Collection Time: 03/30/17  4:43 PM  Result Value Ref Range   Color, Urine YELLOW YELLOW   APPearance CLOUDY (A) CLEAR   Specific Gravity, Urine 1.013 1.005 - 1.030   pH 5.0 5.0 - 8.0   Glucose, UA NEGATIVE NEGATIVE mg/dL   Hgb urine dipstick MODERATE (A) NEGATIVE   Bilirubin Urine NEGATIVE NEGATIVE   Ketones, ur NEGATIVE NEGATIVE mg/dL   Protein, ur 100 (A) NEGATIVE mg/dL   Nitrite POSITIVE (A) NEGATIVE   Leukocytes, UA LARGE (A) NEGATIVE   RBC / HPF 6-30 0 - 5 RBC/hpf   WBC, UA TOO NUMEROUS TO COUNT 0 - 5 WBC/hpf   Bacteria, UA RARE (A) NONE SEEN   Squamous Epithelial / LPF 0-5 (A) NONE SEEN   WBC Clumps PRESENT    Mucous PRESENT    Non Squamous Epithelial 0-5 (A) NONE SEEN   No results found for this or any previous visit (from the past 240 hour(s)). Creatinine:  Recent Labs  03/28/17 0052 03/30/17 1446  CREATININE 0.97 3.00*    Impression/Assessment/plan: She looks better now but still soft BP and HR 110's. Discussed with patient, daughter and Aunt the the nature, potential benefits, risks and alternatives to cystoscopy, left RGP and left stent, including side effects of the proposed treatment, the likelihood of the patient achieving the goals of the procedure, and any potential problems that might occur during the procedure or recuperation. We discussed she could have passed the stone and the cysto/RGP will help tell but we could repeat a CT first. We also discussed stent pain and the rationale for a staged procedure with URS/HLL down the road (they asked about the "laser" to break stone). All questions answered. Patient elects to proceed with cysto/stent.   Addendum: I was able to review the CT, KUB and [place] films with patient's daughter prior to rolling pt back. All questions answered.   ,  03/30/2017, 5:35 PM

## 2017-03-30 NOTE — ED Triage Notes (Signed)
Patient is alert and oriented x4.  She is being seen for a fever of 105.0 per EMS.  Patient was seen 2 days ago for a kidney stone at [facility].  Patient has been weak and lethargic over the past two days and was instructed to come to the ED by her primary.  Currently she denies any pain.

## 2017-03-30 NOTE — Anesthesia Preprocedure Evaluation (Addendum)
Anesthesia Evaluation  Patient identified by MRN, date of birth, ID band Patient awake    Reviewed: Allergy & Precautions, NPO status , Patient's Chart, lab work & pertinent test results  Airway Mallampati: II  TM Distance: >3 FB Neck ROM: Full    Dental no notable dental hx.    Pulmonary neg pulmonary ROS,    Pulmonary exam normal breath sounds clear to auscultation       Cardiovascular hypertension, Pt. on medications negative cardio ROS Normal cardiovascular exam Rhythm:Regular Rate:Normal     Neuro/Psych negative neurological ROS  negative psych ROS   GI/Hepatic negative GI ROS, Neg liver ROS,   Endo/Other  negative endocrine ROS  Renal/GU Renal diseasenegative Renal ROSSeptic  negative genitourinary   Musculoskeletal negative musculoskeletal ROS (+)   Abdominal   Peds negative pediatric ROS (+)  Hematology negative hematology ROS (+)   Anesthesia Other Findings   Reproductive/Obstetrics negative OB ROS                            Anesthesia Physical Anesthesia Plan  ASA: II and emergent  Anesthesia Plan: General   Post-op Pain Management:    Induction: Intravenous, Rapid sequence and Cricoid pressure planned  Airway Management Planned: Oral ETT  Additional Equipment:   Intra-op Plan:   Post-operative Plan: Extubation in OR  Informed Consent: I have reviewed the patients History and Physical, chart, labs and discussed the procedure including the risks, benefits and alternatives for the proposed anesthesia with the patient or authorized representative who has indicated his/her understanding and acceptance.   Dental advisory given  Plan Discussed with: CRNA  Anesthesia Plan Comments:         Anesthesia Quick Evaluation

## 2017-03-30 NOTE — H&P (Signed)
PULMONARY / CRITICAL CARE MEDICINE   Name: Wendy Walsh MRN: [id number] DOB: May 14, 1951    ADMISSION DATE:  03/30/2017 CONSULTATION DATE:  03/30/17  REFERRING MD:  Junious Silk  CHIEF COMPLAINT:  Left flank pain  HISTORY OF PRESENT ILLNESS:   Wendy Walsh is a 66 y.o. female with PMH as outlined below. She presented to [facility] ED 5/28 with left flank pain and LLQ pain x 2 days.  CT scan demonstrated 5.5 x 35mm left PUJ stone.  She was discharged home with pain meds and instructions to f/u with urology. On 03/30/17, she had persistent pain, fever with Tm 105, nausea, lightheadedness, weakness; therefore, she presented back to ED.  Renal US demonstrated hydroureteronephrosis.  Urology was consulted and pt was taken to OR for cystoscopy with left retrograde pyelogram and left ureteral stent placement.  Following the procedure, she remained hypotensive despite IVF's.  Neosynephrine was continued and PCCM was asked to admit to ICU.  PAST MEDICAL HISTORY :  She  has a past medical history of Hypertension and Kidney stones.  PAST SURGICAL HISTORY: She  has a past surgical history that includes Abdominal hysterectomy.  Not on File  No current facility-administered medications on file prior to encounter.    Current Outpatient Prescriptions on File Prior to Encounter  Medication Sig  . amLODipine (NORVASC) 5 MG tablet Take 5 mg by mouth daily.  Marland Kitchen ibuprofen (ADVIL,MOTRIN) 600 MG tablet Take 1 tablet (600 mg total) by mouth every 6 (six) hours as needed. (Patient taking differently: Take 600 mg by mouth every 6 (six) hours as needed for moderate pain. )  . oxyCODONE-acetaminophen (PERCOCET/ROXICET) 5-325 MG tablet Take 1-2 tablets by mouth every 6 (six) hours as needed for severe pain.  . promethazine (PHENERGAN) 25 MG tablet Take 1 tablet (25 mg total) by mouth every 6 (six) hours as needed for nausea or vomiting.  . tamsulosin (FLOMAX) 0.4 MG CAPS capsule Take 1 capsule (0.4 mg total) by mouth  daily.    FAMILY HISTORY:  Her has no family status information on file.    SOCIAL HISTORY: She  reports that she has never smoked. She has never used smokeless tobacco. She reports that she does not drink alcohol or use drugs.  REVIEW OF SYSTEMS:   Not english speaking  SUBJECTIVE:  No new complaints, no pain or dizziness  VITAL SIGNS: BP (!) 92/58 (BP Location: Left Arm)   Pulse (!) 106   Temp 97.4 F (36.3 C)   Resp 16   Ht 5\' 5"  (1.651 m)   Wt 63.5 kg (140 lb)   SpO2 98%   BMI 23.30 kg/m   HEMODYNAMICS:    VENTILATOR SETTINGS:    INTAKE / OUTPUT: I/O last 3 completed shifts: In: 3250 [IV Piggyback:3250] Out: -    PHYSICAL EXAMINATION: General: Well appearing, NAD Neuro: Alert and interactive through a translator HEENT: [place]/AT, PERRL, EOM-I and MMM Cardiovascular: RRR, Nl S1/S2, -M/R/G. Lungs: CTA bilaterally Abdomen: Soft, NT, ND and +BS Musculoskeletal: -edema and -tenderness Skin: Intact  LABS:  BMET  Recent Labs Lab 03/28/17 0052 03/30/17 1446  NA 140 138  K 3.1* 4.3  CL 107 108  CO2 25 19*  BUN 14 32*  CREATININE 0.97 3.00*  GLUCOSE 123* 160*    Electrolytes  Recent Labs Lab 03/28/17 0052 03/30/17 1446  CALCIUM 9.0 8.2*    CBC  Recent Labs Lab 03/28/17 0052 03/30/17 1446  WBC 9.9 3.3*  HGB 13.0 12.1  HCT 39.4 37.8  PLT  162 108*    Coag's No results for input(s): APTT, INR in the last 168 hours.  Sepsis Markers  Recent Labs Lab 03/30/17 1456 03/30/17 1756  LATICACIDVEN 4.80* 2.91*    ABG No results for input(s): PHART, PCO2ART, PO2ART in the last 168 hours.  Liver Enzymes  Recent Labs Lab 03/28/17 0052 03/30/17 1446  AST 27 45*  ALT 18 26  ALKPHOS 70 113  BILITOT 1.1 1.4*  ALBUMIN 3.8 3.1*    Cardiac Enzymes No results for input(s): TROPONINI, PROBNP in the last 168 hours.  Glucose No results for input(s): GLUCAP in the last 168 hours.  Imaging Dg Abd 1 View  Result Date:  03/30/2017 CLINICAL DATA:  66 y/o  F; fever and recent left kidney stone. EXAM: ABDOMEN - 1 VIEW COMPARISON:  03/28/2017 CT of the abdomen and pelvis. FINDINGS: The bowel gas pattern is normal. No radio-opaque calculi or other significant radiographic abnormality are seen. IMPRESSION: Negative. Left ureteral stone on prior CT of abdomen and pelvis not identified. Electronically Signed   By: Kristine Garbe M.D.   On: 03/30/2017 17:14   US Renal  Result Date: 03/30/2017 CLINICAL DATA:  Ureteral stone 2 days ago with pain and sepsis. Check for persistent left-sided hydronephrosis. EXAM: RENAL / URINARY TRACT ULTRASOUND COMPLETE COMPARISON:  03/28/2017 CT FINDINGS: Right Kidney: Length: 10 cm. Echogenicity within normal limits. No mass or hydronephrosis visualized. Left Kidney: Length: 11.1 cm. Echogenicity within normal limits. Moderate left-sided hydronephrosis and hydroureter is noted. Bladder: Although there are bilateral ureteral jets demonstrated within the bladder, the left side is far weaker than on the right possibly from ureteral edema or partial obstruction from a calculus. IMPRESSION: Moderate left-sided hydroureteronephrosis is present. Weak left ureteral jet stream suggests either the presence of ureteral edema or partially occlusive stone. Findings were discussed by telephone at the time of interpretation on 03/30/2017 at 6:00 pm with Dr. Lajean Saver , who verbally acknowledged these results. Electronically Signed   By: Ashley Royalty M.D.   On: 03/30/2017 18:00   Dg Chest Port 1 View  Result Date: 03/30/2017 CLINICAL DATA:  Fever and lethargy EXAM: PORTABLE CHEST 1 VIEW COMPARISON:  None. FINDINGS: The heart size and mediastinal contours are within normal limits. Both lungs are clear. The visualized skeletal structures are unremarkable. IMPRESSION: No active disease. Electronically Signed   By: Ashley Royalty M.D.   On: 03/30/2017 15:02   Dg C-arm 1-60 Min-no Report  Result Date:  03/30/2017 Fluoroscopy was utilized by the requesting physician.  No radiographic interpretation.     STUDIES:  Renal US 5/30 > moderate left sided hydroureteronephrosis.  CULTURES: Urine 5/30 >  Blood 5/30 >   ANTIBIOTICS: Vanc 5/30 >  Ceftriaxone 5/30 >   SIGNIFICANT EVENTS: 5/30 > admit.  LINES/TUBES: Left ureteral stent 5/30 >   DISCUSSION: 66 y.o. female admitted 5/30 with left PUJ stone with resultant hydroureteronephrosis.  Taken to OR for cystoscopy and placement of left ureteral stent.  Following OR, remained hypotensive so left on neosynephrine.  PCCM asked to admit.  ASSESSMENT / PLAN:  CARDIOVASCULAR A:  Septic shock - presumed due to urosepsis.  A repeat sepsis assessment has been performed. Troponin leak. Hx HTN. P:  Continue neosynephrine as needed to maintain goal MAP > 65. Trend troponins, lactate. Assess echo. Hold preadmission amlodipine.  INFECTIOUS A:   Septic shock - presumed due to urosepsis.  A repeat sepsis assessment has been performed. P:   Abx as above (vanc / ceftriaxone).  Follow cultures as above. PCT algorithm to limit abx exposure.  PULMONARY A: No acute issues. P:   No interventions required.  RENAL A:   AKI - presumed due to hydroureteronephrosis from left UPJ stone.  Now s/p left ureteral stent. Pseudohypocalcemia - corrects to 8.92. P:   NS @ 100. Assess ionized calcium. BMP in AM. Continue flomax.  GASTROINTESTINAL A:   Nutrition. P:   Advance diet as tolerated.  HEMATOLOGIC A:   Thrombocytopenia. VTE Prophylaxis. P:  Monitor platelet counts. SCD's / heparin. CBC in AM.  ENDOCRINE A:   No acute issues.   P:   No interventions required.  NEUROLOGIC A:   No acute issues. P:   No interventions required.  Family updated: Daughter updated bedside.  Interdisciplinary Family Meeting v Palliative Care Meeting:  Due by: 04/05/17.  CC time: 30 min.  Attending Note:  66 year old female with PMH  above presenting to ICU post ureter stent placement and acute renal failure.  Patient was noted to be hypotensive and neo was started at 20 mcg.  On exam, she is awake and interactive, moving all ext to commands with clear lungs.  I reviewed CXR myself, no acute disease noted.  Will admit to the ICU.  Pan cultures.  Rocephin and vanc.  Extra dose of NS at 500 ml IV.  Titrate neo.  AM labs ordered and will replace electrolytes.  I do not believe troponins are reliable with hypotensive, low level and renal failure.  EKG and will only involve cardiology is significant rise.  PCCM will see again in AM.  The patient is critically ill with multiple organ systems failure and requires high complexity decision making for assessment and support, frequent evaluation and titration of therapies, application of advanced monitoring technologies and extensive interpretation of multiple databases.   Critical Care Time devoted to patient care services described in this note is  35  Minutes. This time reflects time of care of this signee Dr Jennet Maduro. This critical care time does not reflect procedure time, or teaching time or supervisory time of PA/NP/Med student/Med Resident etc but could involve care discussion time.  Rush Farmer, M.D. [facility] Pulmonary/Critical Care Medicine. Pager: [phone]. After hours pager: [phone].

## 2017-03-30 NOTE — Progress Notes (Signed)
Pharmacy: ceftriaxone  Patient's a 66 y.o. F with recent kidney stone presented to the ED on 03/30/17 with c/o fever.  To start ceftriaxone for suspected UTI.  Plan: - Ceftriaxone 2gm IV x1 per MD, then 1 gm IV q24h - No renal adjustment is needed with Ceftriaxone -- pharmacy will sign off - Re-consult [place] if need further assistance  Thank you for asking pharmacy to be part of this patient's care.  Dia Sitter, PharmD, BCPS 03/30/2017 3:00 PM

## 2017-03-30 NOTE — Anesthesia Procedure Notes (Signed)
Procedure Name: Intubation Date/Time: 03/30/2017 6:51 PM Performed by: Noralyn Pick D Pre-anesthesia Checklist: Patient identified, Emergency Drugs available, Suction available and Patient being monitored Patient Re-evaluated:Patient Re-evaluated prior to inductionOxygen Delivery Method: Circle system utilized Preoxygenation: Pre-oxygenation with 100% oxygen Intubation Type: IV induction and Rapid sequence Laryngoscope Size: Mac and 3 Grade View: Grade III Tube type: Oral Tube size: 7.5 mm Number of attempts: 2 Airway Equipment and Method: Stylet,  Oral airway and Bougie stylet Placement Confirmation: ETT inserted through vocal cords under direct vision,  positive ETCO2 and breath sounds checked- equal and bilateral Secured at: 21 cm Tube secured with: Tape Dental Injury: Teeth and Oropharynx as per pre-operative assessment

## 2017-03-30 NOTE — ED Notes (Signed)
Pt transported to OR

## 2017-03-30 NOTE — Progress Notes (Signed)
[facility] Progress Note Patient Name: Wendy Walsh DOB: 04-27-1951 MRN: [id number]   Date of Service  03/30/2017  HPI/Events of Note  Notified by bedside nurse of very low level septic shock. Lactic acid still marginally elevated and likely secondary to acute renal failure. Troponin I slightly elevated at 0.61 and likely due to subendocardial ischemia/demand ischemia in the setting of septic shock. Acute renal failure also likely limiting clearance of troponin.   eICU Interventions  1. Continuing to trend lactic acid 2. Continuing to trend troponin I 3. Initiating weaning of vasopressor infusion 4. Serum cortisol pending 5. Checking complete echocardiogram to identify any new focal wall motion abnormalities      Intervention Category Major Interventions: Sepsis - evaluation and management;Shock - evaluation and management  Tera Partridge 03/30/2017, 9:59 PM

## 2017-03-30 NOTE — ED Provider Notes (Addendum)
[facility] DEPT Provider Note   CSN: [id number] Arrival date & time: 03/30/17  1357     History   Chief Complaint Chief Complaint  Patient presents with  . Code Sepsis    HPI Martita Ricciardelli is a 66 y.o. female.  Patient with hx recently dx kidney stone, presents w fever to 105, nausea, lightheadedness/weakness, general malaise, onset today.  Is still having left flank pain, but improved from prior.   No anterior/abdominal pain. No diarrhea. Occasional non prod cough. No sore throat or other uri c/o. No headache. No chest pain or sob. No dysuria.    The history is provided by the patient and a relative. A language interpreter was used.    Past Medical History:  Diagnosis Date  . Hypertension   . Kidney stones     There are no active problems to display for this patient.   Past Surgical History:  Procedure Laterality Date  . ABDOMINAL HYSTERECTOMY      OB History    No data available       Home Medications    Prior to Admission medications   Medication Sig Start Date End Date Taking? Authorizing Provider  amLODipine (NORVASC) 5 MG tablet Take 5 mg by mouth daily.    [provider]  ibuprofen (ADVIL,MOTRIN) 600 MG tablet Take 1 tablet (600 mg total) by mouth every 6 (six) hours as needed. 03/28/17   Antonietta Breach, PA-C  oxyCODONE-acetaminophen (PERCOCET/ROXICET) 5-325 MG tablet Take 1-2 tablets by mouth every 6 (six) hours as needed for severe pain. 03/28/17   Antonietta Breach, PA-C  promethazine (PHENERGAN) 25 MG tablet Take 1 tablet (25 mg total) by mouth every 6 (six) hours as needed for nausea or vomiting. 03/28/17   Antonietta Breach, PA-C  tamsulosin (FLOMAX) 0.4 MG CAPS capsule Take 1 capsule (0.4 mg total) by mouth daily. 03/28/17   Antonietta Breach, PA-C    Family History History reviewed. No pertinent family history.  Social History Social History  Substance Use Topics  . Smoking status: Never Smoker  . Smokeless tobacco: Never Used  . Alcohol use  No     Allergies   Patient has no known allergies.   Review of Systems Review of Systems  Constitutional: Positive for chills and fever.  HENT: Negative for sore throat.   Eyes: Negative for redness.  Respiratory: Positive for cough. Negative for shortness of breath.   Cardiovascular: Negative for chest pain.  Gastrointestinal: Positive for nausea. Negative for abdominal pain.  Genitourinary: Positive for flank pain.  Musculoskeletal: Negative for neck pain and neck stiffness.  Skin: Negative for rash.  Neurological: Negative for headaches.  Hematological: Does not bruise/bleed easily.  Psychiatric/Behavioral: Negative for confusion.     Physical Exam Updated Vital Signs BP (!) 93/49 (BP Location: Right Arm)   Temp (!) 102.4 F (39.1 C) (Oral)   Resp 18   Ht 1.651 m (5\' 5" )   Wt 63.5 kg (140 lb)   SpO2 92%   BMI 23.30 kg/m   Physical Exam  Constitutional: She appears well-developed and well-nourished. No distress.  Febrile.   Eyes: Conjunctivae are normal. No scleral icterus.  Neck: Neck supple. No tracheal deviation present.  No stiffness or rigidity  Cardiovascular: Normal rate, regular rhythm, normal heart sounds and intact distal pulses.  Exam reveals no gallop and no friction rub.   No murmur heard. Pulmonary/Chest: Effort normal and breath sounds normal. No respiratory distress.  Abdominal: Soft. Normal appearance and bowel sounds are normal.  She exhibits no distension. There is no tenderness.  Genitourinary:  Genitourinary Comments: Left cva tenderness.   Musculoskeletal: She exhibits no edema.  Neurological: She is alert.  Skin: Skin is warm and dry. No rash noted. She is not diaphoretic.  Psychiatric: She has a normal mood and affect.  Nursing note and vitals reviewed.    ED Treatments / Results  Labs (all labs ordered are listed, but only abnormal results are displayed)  Results for orders placed or performed during the hospital encounter of  03/30/17  Comprehensive metabolic panel  Result Value Ref Range   Sodium 138 135 - 145 mmol/L   Potassium 4.3 3.5 - 5.1 mmol/L   Chloride 108 101 - 111 mmol/L   CO2 19 (L) 22 - 32 mmol/L   Glucose, Bld 160 (H) 65 - 99 mg/dL   BUN 32 (H) 6 - 20 mg/dL   Creatinine, Ser 3.00 (H) 0.44 - 1.00 mg/dL   Calcium 8.2 (L) 8.9 - 10.3 mg/dL   Total Protein 6.5 6.5 - 8.1 g/dL   Albumin 3.1 (L) 3.5 - 5.0 g/dL   AST 45 (H) 15 - 41 U/L   ALT 26 14 - 54 U/L   Alkaline Phosphatase 113 38 - 126 U/L   Total Bilirubin 1.4 (H) 0.3 - 1.2 mg/dL   GFR calc non Af Amer 15 (L) >60 mL/min   GFR calc Af Amer 18 (L) >60 mL/min   Anion gap 11 5 - 15  CBC WITH DIFFERENTIAL  Result Value Ref Range   WBC 3.3 (L) 4.0 - 10.5 K/uL   RBC 4.49 3.87 - 5.11 MIL/uL   Hemoglobin 12.1 12.0 - 15.0 g/dL   HCT 37.8 36.0 - 46.0 %   MCV 84.2 78.0 - 100.0 fL   MCH 26.9 26.0 - 34.0 pg   MCHC 32.0 30.0 - 36.0 g/dL   RDW 13.7 11.5 - 15.5 %   Platelets 108 (L) 150 - 400 K/uL   Neutrophils Relative % 94 %   Lymphocytes Relative 3 %   Monocytes Relative 2 %   Eosinophils Relative 1 %   Basophils Relative 0 %   Neutro Abs 3.1 1.7 - 7.7 K/uL   Lymphs Abs 0.1 (L) 0.7 - 4.0 K/uL   Monocytes Absolute 0.1 0.1 - 1.0 K/uL   Eosinophils Absolute 0.0 0.0 - 0.7 K/uL   Basophils Absolute 0.0 0.0 - 0.1 K/uL   WBC Morphology VACUOLATED NEUTROPHILS   I-Stat CG4 Lactic Acid, ED  (not at  [facility])  Result Value Ref Range   Lactic Acid, Venous 4.80 (HH) 0.5 - 1.9 mmol/L   Comment NOTIFIED PHYSICIAN    Dg Chest Port 1 View  Result Date: 03/30/2017 CLINICAL DATA:  Fever and lethargy EXAM: PORTABLE CHEST 1 VIEW COMPARISON:  None. FINDINGS: The heart size and mediastinal contours are within normal limits. Both lungs are clear. The visualized skeletal structures are unremarkable. IMPRESSION: No active disease. Electronically Signed   By: Ashley Royalty M.D.   On: 03/30/2017 15:02   Ct Renal Stone Study  Result Date: 03/28/2017 CLINICAL DATA:  Acute  onset of left flank pain, radiating to the left lower quadrant. Chills, nausea and vomiting. Red blood cells and white blood cells in the urine. Initial encounter. EXAM: CT ABDOMEN AND PELVIS WITHOUT CONTRAST TECHNIQUE: Multidetector CT imaging of the abdomen and pelvis was performed following the standard protocol without IV contrast. COMPARISON:  None. FINDINGS: Lower chest: Minimal bibasilar atelectasis is noted. The visualized portions of  the mediastinum are unremarkable. Hepatobiliary: The liver is unremarkable in appearance. The gallbladder is unremarkable in appearance. The common bile duct remains normal in caliber. Pancreas: The pancreas is within normal limits. Spleen: The spleen is unremarkable in appearance. Adrenals/Urinary Tract: The adrenal glands are unremarkable in appearance. There is minimal left-sided hydronephrosis, with prominence of the proximal left ureter. An obstructing 6 x 3 mm stone is noted at the left mid ureter. Left-sided perinephric stranding is noted. The right kidney is unremarkable in appearance. Stomach/Bowel: The stomach is unremarkable in appearance. The small bowel is within normal limits. The appendix is normal in caliber, without evidence of appendicitis. The colon is unremarkable in appearance. Vascular/Lymphatic: Minimal calcification is seen in the mid abdominal aorta. No retroperitoneal or pelvic sidewall lymphadenopathy is seen. Reproductive: The bladder is mildly distended and grossly unremarkable in appearance. The patient is status post hysterectomy. No suspicious adnexal masses are seen. Other: No additional soft tissue abnormalities are seen. Musculoskeletal: No acute osseous abnormalities are identified. The visualized musculature is unremarkable in appearance. IMPRESSION: Minimal left-sided hydronephrosis, with an obstructing 6 x 3 mm stone at the left mid ureter. Electronically Signed   By: Garald Balding M.D.   On: 03/28/2017 02:39     EKG  EKG  Interpretation None       Radiology Dg Chest [facility] 1 View  Result Date: 03/30/2017 CLINICAL DATA:  Fever and lethargy EXAM: PORTABLE CHEST 1 VIEW COMPARISON:  None. FINDINGS: The heart size and mediastinal contours are within normal limits. Both lungs are clear. The visualized skeletal structures are unremarkable. IMPRESSION: No active disease. Electronically Signed   By: Ashley Royalty M.D.   On: 03/30/2017 15:02    Procedures Procedures (including critical care time)  Medications Ordered in ED Medications - No data to display   Initial Impression / Assessment and Plan / ED Course  I have reviewed the triage vital signs and the nursing notes.  Pertinent labs & imaging results that were available during my care of the patient were reviewed by me and considered in my medical decision making (see chart for details).  Iv ns bolus. Labs.  Cultures.  Reviewed nursing notes and prior charts for additional history.   Reviewed ED workup from a couple days ago - left ureteral stone.  Also w 6-30 wbc in urine, no abx rx.   Iv abx.   Urology consulted. PCCM paged for admission.   Patient hypotensive, bp in 80s.  30 cc/kg ns bolus.   2nd iv line.  CRITICAL CARE  RE septic shock, hypotension, Performed by: Mirna Mires Total critical care time: 40 minutes Critical care time was exclusive of separately billable procedures and treating other patients. Critical care was necessary to treat or prevent imminent or life-threatening deterioration. Critical care was time spent personally by me on the following activities: development of treatment plan with patient and/or surrogate as well as nursing, discussions with consultants, evaluation of patient's response to treatment, examination of patient, obtaining history from patient or surrogate, ordering and performing treatments and interventions, ordering and review of laboratory studies, ordering and review of radiographic studies, pulse oximetry  and re-evaluation of patient's condition.  Discussed with pccm, aki, bp in 80s,  - they requests more ivf and callback.  Discussed with Dr Junious Silk - severe sepsis, ureteral stent - he will see and take for stent - he requests u/s, npo, and will see.  BP still in 80s, critical care re-paged for admission.   Final Clinical Impressions(s) /  ED Diagnoses   Final diagnoses:  None    New Prescriptions New Prescriptions   No medications on file         Lajean Saver, MD 03/30/17 1635

## 2017-03-30 NOTE — Progress Notes (Signed)
Pharmacy Antibiotic Note  Wendy Walsh is a 66 y.o. female admitted on 03/30/2017 with sepsis.  Pharmacy has been consulted for vancomycin dosing. She has received vanc 1 gm at 1640 today and Rocephin 2 gm at 1519.  Wt 63.5 kg. WBC low at 3.3, creat 0.97>>3, creat cl < 30 ml/min, lactate 4.8>2.91, Tmax 102.4.  Pt with L ureteral stone s/p stent placement 5/30.   Plan: Vancomycin 1000 mg  IV every 48 hours.  Goal trough 15-20 mcg/mL.  F/u renal fxn, wbc, temp, culture data Vancomycin levels as needed  Height: 5\' 5"  (165.1 cm) Weight: 140 lb (63.5 kg) IBW/kg (Calculated) : 57  Temp (24hrs), Avg:99.9 F (37.7 C), Min:97.4 F (36.3 C), Max:102.4 F (39.1 C)   Recent Labs Lab 03/28/17 0052 03/30/17 1446 03/30/17 1456 03/30/17 1756  WBC 9.9 3.3*  --   --   CREATININE 0.97 3.00*  --   --   LATICACIDVEN  --   --  4.80* 2.91*    Estimated Creatinine Clearance: 17 mL/min (A) (by C-G formula based on SCr of 3 mg/dL (H)).    Not on [place], Pharm.D. [phone] 03/30/2017 8:06 PM

## 2017-03-30 NOTE — Op Note (Signed)
Preoperative diagnosis: Left ureteral stone, sepsis Postoperative diagnosis: Same  Procedure: Cystoscopy with left retrograde pyelogram left ureteral stent placement  Surgeon: Junious Silk  Anesthesia: Gen.  Indication for procedure: 66 year old female with sepsis and continued hydronephrosis on ultrasound concerning for continued obstruction from ureteral stone noted on prior CT.  Findings:  on cystoscopy the urethra and the bladder appeared normal. There was no stone or foreign body in the bladder. there was a good right ureteral jet but no left ureteral jet was visualized. There was no stone or foreign body in the bladder. On scout imaging a small stone was noted at the left ureterovesical junction/left distal ureter.   Left retrograde pyelogram-this outlined a single ureter single collecting system unit with a filling defect in the distal ureter consistent with the stone and mild dilation of the ureter and collecting system proximally.   Description of procedure: After consent was obtained the patient was brought to the operating room. After adequate anesthesia she was placed in lithotomy position and prepped and draped in the usual sterile fashion. The cystoscope was passed per urethra and the bladder flushed out several times to get a good visual as the urine was quite cloudy. The left ureter was cannulated with a 6 [place] open-ended catheter and a gentle retrograde was performed. The sensor wire was advanced and then the 6 French catheter advanced to the proximal ureter. At first the entire collecting system was in outline. With the wire out there was a good hydronephrotic drip and urine was sent for culture. I then did another gentle retrograde which completely opacified the collecting system. The wire was free coiled in the upper pole calyx. A 6 x 24 cm stent was advanced. There goes a good coil in the kidney and a good coil in the upper pole calyx. The scope was removed. I noticed a small amount  of blood in the vault and on further inspection there was some oozing from the meatus. There was no vaginal bleeding. I placed a 16 French Foley to max drain the system. The patient was septic throughout the case and required neo-drip even prior to manipulation or insertion of the cystoscope. She was awakened and taken to the recovery room in guarded condition.  Complications: None  Blood loss: Minimal  Specimens to lab: Urine from left kidney for culture   Drains: 6 x 24 cm left ureteral stent   Disposition: Patient in guarded condition to PACU and ICU.

## 2017-03-30 NOTE — Progress Notes (Signed)
I spoke with family and Dr. Ashok Cordia who will put in admit orders. I left a foley which can come out when UOP monitoring is no longer needed.

## 2017-03-30 NOTE — Progress Notes (Signed)
Discussed with Dr. Ashok Cordia. Pt with septic shock and a known 6 mm left mid ureteral stone. Still has flank pain. [place] and KUB pending, but I am setting up with OR for urgent stent.

## 2017-03-30 NOTE — Discharge Instructions (Signed)
Ureteral Stent Implantation, Care After °Refer to this sheet in the next few weeks. These instructions provide you with information about caring for yourself after your procedure. Your health care provider may also give you more specific instructions. Your treatment has been planned according to current medical practices, but problems sometimes occur. Call your health care provider if you have any problems or questions after your procedure. °What can I expect after the procedure? °After the procedure, it is common to have: °· Nausea. °· Mild pain when you urinate. You may feel this pain in your lower back or lower abdomen. Pain should stop within a few minutes after you urinate. This may last for up to 1 week. °· A small amount of blood in your urine for several days. °Follow these instructions at home: °  °Medicines  °· Take over-the-counter and prescription medicines only as told by your health care provider. °· If you were prescribed an antibiotic medicine, take it as told by your health care provider. Do not stop taking the antibiotic even if you start to feel better. °· Do not drive for 24 hours if you received a sedative. °· Do not drive or operate heavy machinery while taking prescription pain medicines. °Activity  °· Return to your normal activities as told by your health care provider. Ask your health care provider what activities are safe for you. °· Do not lift anything that is heavier than 10 lb (4.5 kg). Follow this limit for 1 week after your procedure, or for as long as told by your health care provider. °General instructions  °· Watch for any blood in your urine. Call your health care provider if the amount of blood in your urine increases. °· If you have a catheter: °¨ Follow instructions from your health care provider about taking care of your catheter and collection bag. °¨ Do not take baths, swim, or use a hot tub until your health care provider approves. °· Drink enough fluid to keep your urine  clear or pale yellow. °· Keep all follow-up visits as told by your health care provider. This is important. °Contact a health care provider if: °· You have pain that gets worse or does not get better with medicine, especially pain when you urinate. °· You have difficulty urinating. °· You feel nauseous or you vomit repeatedly during a period of more than 2 days after the procedure. °Get help right away if: °· Your urine is dark red or has blood clots in it. °· You are leaking urine (have incontinence). °· The end of the stent comes out of your urethra. °· You cannot urinate. °· You have sudden, sharp, or severe pain in your abdomen or lower back. °· You have a fever. °This information is not intended to replace advice given to you by your health care provider. Make sure you discuss any questions you have with your health care provider. °Document Released: 06/20/2013 Document Revised: 03/25/2016 Document Reviewed: 05/02/2015 °Elsevier Interactive Patient Education © 2017 Elsevier Inc. ° °

## 2017-03-30 NOTE — Progress Notes (Signed)
eLink Physician-Brief Progress Note Patient Name: Wendy Walsh DOB: July 17, 1951 MRN: [id number]   Date of Service  03/30/2017  HPI/Events of Note  Patient presenting with septic shock secondary to left ureteral stone obstruction. Status post stent placement by urology. Continuing on very low-dose Neo-Synephrine for blood pressure support. Patient appears comfortable on camera check. Foley catheter in place. Empiric vancomycin and Rocephin ordered. Awaiting culture results.   eICU Interventions  PCCM PA to evaluate patient as available.     Intervention Category Evaluation Type: New Patient Evaluation  Tera Partridge 03/30/2017, 8:46 PM

## 2017-03-31 ENCOUNTER — Encounter (HOSPITAL_COMMUNITY): Payer: Self-pay | Admitting: Urology

## 2017-03-31 ENCOUNTER — Other Ambulatory Visit (HOSPITAL_COMMUNITY): Payer: Self-pay

## 2017-03-31 DIAGNOSIS — N179 Acute kidney failure, unspecified: Secondary | ICD-10-CM

## 2017-03-31 DIAGNOSIS — N201 Calculus of ureter: Secondary | ICD-10-CM

## 2017-03-31 DIAGNOSIS — A4151 Sepsis due to Escherichia coli [E. coli]: Secondary | ICD-10-CM

## 2017-03-31 DIAGNOSIS — A419 Sepsis, unspecified organism: Secondary | ICD-10-CM | POA: Insufficient documentation

## 2017-03-31 LAB — BLOOD CULTURE ID PANEL (REFLEXED)
ACINETOBACTER BAUMANNII: NOT DETECTED
CANDIDA ALBICANS: NOT DETECTED
CANDIDA KRUSEI: NOT DETECTED
CANDIDA PARAPSILOSIS: NOT DETECTED
Candida glabrata: NOT DETECTED
Candida tropicalis: NOT DETECTED
Carbapenem resistance: NOT DETECTED
ENTEROBACTER CLOACAE COMPLEX: NOT DETECTED
ENTEROBACTERIACEAE SPECIES: DETECTED — AB
ENTEROCOCCUS SPECIES: NOT DETECTED
Escherichia coli: DETECTED — AB
Haemophilus influenzae: NOT DETECTED
KLEBSIELLA OXYTOCA: NOT DETECTED
KLEBSIELLA PNEUMONIAE: NOT DETECTED
LISTERIA MONOCYTOGENES: NOT DETECTED
Neisseria meningitidis: NOT DETECTED
PSEUDOMONAS AERUGINOSA: NOT DETECTED
Proteus species: NOT DETECTED
STAPHYLOCOCCUS AUREUS BCID: NOT DETECTED
STREPTOCOCCUS PYOGENES: NOT DETECTED
STREPTOCOCCUS SPECIES: NOT DETECTED
Serratia marcescens: NOT DETECTED
Staphylococcus species: NOT DETECTED
Streptococcus agalactiae: NOT DETECTED
Streptococcus pneumoniae: NOT DETECTED

## 2017-03-31 LAB — BASIC METABOLIC PANEL
Anion gap: 7 (ref 5–15)
BUN: 25 mg/dL — AB (ref 6–20)
CO2: 20 mmol/L — ABNORMAL LOW (ref 22–32)
CREATININE: 1.82 mg/dL — AB (ref 0.44–1.00)
Calcium: 7.2 mg/dL — ABNORMAL LOW (ref 8.9–10.3)
Chloride: 115 mmol/L — ABNORMAL HIGH (ref 101–111)
GFR calc Af Amer: 33 mL/min — ABNORMAL LOW (ref >60)
GFR, EST NON AFRICAN AMERICAN: 28 mL/min — AB (ref >60)
GLUCOSE: 88 mg/dL (ref 65–99)
Potassium: 4.9 mmol/L (ref 3.5–5.1)
SODIUM: 142 mmol/L (ref 135–145)

## 2017-03-31 LAB — CBC
HCT: 34.6 % — ABNORMAL LOW (ref 36.0–46.0)
Hemoglobin: 11.2 g/dL — ABNORMAL LOW (ref 12.0–15.0)
MCH: 27.3 pg (ref 26.0–34.0)
MCHC: 32.4 g/dL (ref 30.0–36.0)
MCV: 84.2 fL (ref 78.0–100.0)
Platelets: 75 10*3/uL — ABNORMAL LOW (ref 150–400)
RBC: 4.11 MIL/uL (ref 3.87–5.11)
RDW: 14.2 % (ref 11.5–15.5)
WBC: 12 10*3/uL — ABNORMAL HIGH (ref 4.0–10.5)

## 2017-03-31 LAB — LACTIC ACID, PLASMA: Lactic Acid, Venous: 1.9 mmol/L (ref 0.5–1.9)

## 2017-03-31 LAB — MAGNESIUM: MAGNESIUM: 1.6 mg/dL — AB (ref 1.7–2.4)

## 2017-03-31 LAB — TROPONIN I
Troponin I: 0.67 ng/mL (ref <0.03)
Troponin I: 0.69 ng/mL (ref <0.03)

## 2017-03-31 LAB — HIV ANTIBODY (ROUTINE TESTING W REFLEX): HIV Screen 4th Generation wRfx: NONREACTIVE

## 2017-03-31 LAB — PROCALCITONIN: Procalcitonin: 48.1 ng/mL

## 2017-03-31 LAB — PHOSPHORUS: Phosphorus: 3.7 mg/dL (ref 2.5–4.6)

## 2017-03-31 MED ORDER — ACETAMINOPHEN 325 MG PO TABS
650.0000 mg | ORAL_TABLET | Freq: Four times a day (QID) | ORAL | Status: DC | PRN
  Administered 2017-03-31: 650 mg via ORAL
  Filled 2017-03-31: qty 2

## 2017-03-31 MED ORDER — DEXTROSE 5 % IV SOLN
2.0000 g | INTRAVENOUS | Status: DC
  Administered 2017-03-31 – 2017-04-02 (×3): 2 g via INTRAVENOUS
  Filled 2017-03-31 (×4): qty 2

## 2017-03-31 MED ORDER — LACTATED RINGERS IV SOLN
INTRAVENOUS | Status: DC
  Administered 2017-03-31 – 2017-04-02 (×3): via INTRAVENOUS

## 2017-03-31 NOTE — Care Management Note (Signed)
Case Management Note  Patient Details  Name: Aubry Rankin MRN: [id number] Date of Birth: 21-Apr-1951  Subjective/Objective:                  66 y.o. female with PMH as outlined below. She presented to [facility] ED 5/28 with left flank pain and LLQ pain x 2 days.  CT scan demonstrated 5.5 x 42mm left PUJ stone.  She was discharged home with pain meds and instructions to f/u with urology. On 03/30/17, she had persistent pain, fever with Tm 105, nausea, lightheadedness, weakness; therefore, she presented back to ED.  Renal US demonstrated hydroureteronephrosis.  Urology was consulted and pt was taken to OR for cystoscopy with left retrograde pyelogram and left ureteral stent placement.  Following the procedure, she remained hypotensive despite IVF's.  Neosynephrine was continued and PCCM was asked to admit to ICU.   Action/Plan: Date:  Mar 31, 2017  Chart reviewed for concurrent status and case management needs.  Will continue to follow patient progress.  Discharge Planning: following for needs  Expected discharge date: [id number]  Velva Harman, BSN, [place], [place]   Expected Discharge Date:   (unknown)               Expected Discharge Plan:  Home/Self Care  In-House Referral:     Discharge planning Services  CM Consult  Post Acute Care Choice:    Choice offered to:     DME Arranged:    DME Agency:     HH Arranged:    [place] Agency:     Status of Service:  In process, will continue to follow  If discussed at Long Length of Stay Meetings, dates discussed:    Additional Comments:  Leeroy Cha, RN 03/31/2017, 9:23 AM

## 2017-03-31 NOTE — Progress Notes (Signed)
   Pt without complaints. Still on some pressors.   Vitals:   03/31/17 0620 03/31/17 0800  BP: (!) 87/50   Pulse: 85   Resp: (!) 22   Temp:  98.6 F (37 C)    Intake/Output Summary (Last 24 hours) at 03/31/17 0930 Last data filed at 03/31/17 0600  Gross per 24 hour  Intake           5889.5 ml  Output             1000 ml  Net           4889.5 ml   NAD Alert and oriented  Abd - soft NT Urine clear   CBC    Component Value Date/Time   WBC 12.0 (H) 03/31/2017 0221   RBC 4.11 03/31/2017 0221   HGB 11.2 (L) 03/31/2017 0221   HCT 34.6 (L) 03/31/2017 0221   PLT 75 (L) 03/31/2017 0221   MCV 84.2 03/31/2017 0221   MCH 27.3 03/31/2017 0221   MCHC 32.4 03/31/2017 0221   RDW 14.2 03/31/2017 0221   LYMPHSABS 0.1 (L) 03/30/2017 1446   MONOABS 0.1 03/30/2017 1446   EOSABS 0.0 03/30/2017 1446   BASOSABS 0.0 03/30/2017 1446   BMET    Component Value Date/Time   NA 142 03/31/2017 0221   K 4.9 03/31/2017 0221   CL 115 (H) 03/31/2017 0221   CO2 20 (L) 03/31/2017 0221   GLUCOSE 88 03/31/2017 0221   BUN 25 (H) 03/31/2017 0221   CREATININE 1.82 (H) 03/31/2017 0221   CALCIUM 7.2 (L) 03/31/2017 0221   GFRNONAA 28 (L) 03/31/2017 0221   GFRAA 33 (L) 03/31/2017 0221   A/p - Sepsis in the setting of obstructing left ureteral stone and UTI status post left ureteral stent Mar 30, 2017.  White count improved and kidney function much improved.  Pressors slowly being weaned.  I will sign off but please page GU on-call for any issues.  DC Foley when urine output monitoring no longer needed.

## 2017-03-31 NOTE — Progress Notes (Signed)
eLink Physician-Brief Progress Note Patient Name: Wendy Walsh DOB: 1950-12-25 MRN: [id number]   Date of Service  03/31/2017  HPI/Events of Note  Patient complaining of headache.   eICU Interventions  Tylenol by mouth when necessary ordered      Intervention Category Intermediate Interventions: Pain - evaluation and management  Tera Partridge 03/31/2017, 4:36 PM

## 2017-03-31 NOTE — Progress Notes (Signed)
PULMONARY / CRITICAL CARE MEDICINE   Name: Wendy Walsh MRN: [id number] DOB: 1950-11-07    ADMISSION DATE:  03/30/2017 CONSULTATION DATE:  03/30/17  REFERRING MD:  Junious Silk  CHIEF COMPLAINT:  Left flank pain  HISTORY OF PRESENT ILLNESS:   Wendy Walsh is a 66 y.o. female with PMH as outlined below. She presented to [facility] ED 5/28 with left flank pain and LLQ pain x 2 days.  CT scan demonstrated 5.5 x 77mm left PUJ stone.  She was discharged home with pain meds and instructions to f/u with urology. On 03/30/17, she had persistent pain, fever with Tm 105, nausea, lightheadedness, weakness; therefore, she presented back to ED.  Renal US demonstrated hydroureteronephrosis.  Urology was consulted and pt was taken to OR for cystoscopy with left retrograde pyelogram and left ureteral stent placement.  Following the procedure, she remained hypotensive despite IVF's.  Neosynephrine was continued and PCCM was asked to admit to ICU.   SUBJECTIVE:  No issues overnight. neosynpehrine being weaned off Denies nausea/vomiting/cp/sob Comfortable Less abd pain   VITAL SIGNS: BP (!) 87/50   Pulse 85   Temp 98.6 F (37 C) (Oral)   Resp (!) 22   Ht 5\' 5"  (1.651 m)   Wt 63.5 kg (139 lb 15.9 oz)   SpO2 99%   BMI 23.30 kg/m   HEMODYNAMICS:    VENTILATOR SETTINGS:    INTAKE / OUTPUT: I/O last 3 completed shifts: In: 5889.5 [I.V.:2639.5; IV Piggyback:3250] Out: 1000 [Urine:1000]   PHYSICAL EXAMINATION: General: Well appearing, NAD Neuro: Alert and interactive through a translator HEENT: [place]/AT, PERRL, EOM-I and MMM Cardiovascular: RRR, Nl S1/S2, -M/R/G. Lungs: good ae. some crackles at bases Abdomen: Soft, NT, ND and +BS Musculoskeletal: -edema and -tenderness Skin: Intact  LABS:  BMET  Recent Labs Lab 03/28/17 0052 03/30/17 1446 03/31/17 0221  NA 140 138 142  K 3.1* 4.3 4.9  CL 107 108 115*  CO2 25 19* 20*  BUN 14 32* 25*  CREATININE 0.97 3.00* 1.82*  GLUCOSE 123*  160* 88    Electrolytes  Recent Labs Lab 03/28/17 0052 03/30/17 1446 03/31/17 0221  CALCIUM 9.0 8.2* 7.2*  MG  --   --  1.6*  PHOS  --   --  3.7    CBC  Recent Labs Lab 03/28/17 0052 03/30/17 1446 03/31/17 0221  WBC 9.9 3.3* 12.0*  HGB 13.0 12.1 11.2*  HCT 39.4 37.8 34.6*  PLT 162 108* 75*    Coag's No results for input(s): APTT, INR in the last 168 hours.  Sepsis Markers  Recent Labs Lab 03/30/17 1756 03/30/17 2107 03/31/17 0006 03/31/17 0221  LATICACIDVEN 2.91* 3.5* 1.9  --   PROCALCITON  --  49.36  --  48.10    ABG No results for input(s): PHART, PCO2ART, PO2ART in the last 168 hours.  Liver Enzymes  Recent Labs Lab 03/28/17 0052 03/30/17 1446  AST 27 45*  ALT 18 26  ALKPHOS 70 113  BILITOT 1.1 1.4*  ALBUMIN 3.8 3.1*    Cardiac Enzymes  Recent Labs Lab 03/30/17 2107 03/31/17 0221 03/31/17 0736  TROPONINI 0.61* 0.67* 0.69*    Glucose No results for input(s): GLUCAP in the last 168 hours.  Imaging Dg Abd 1 View  Result Date: 03/30/2017 CLINICAL DATA:  66 y/o  F; fever and recent left kidney stone. EXAM: ABDOMEN - 1 VIEW COMPARISON:  03/28/2017 CT of the abdomen and pelvis. FINDINGS: The bowel gas pattern is normal. No radio-opaque calculi or other significant  radiographic abnormality are seen. IMPRESSION: Negative. Left ureteral stone on prior CT of abdomen and pelvis not identified. Electronically Signed   By: Kristine Garbe M.D.   On: 03/30/2017 17:14   US Renal  Result Date: 03/30/2017 CLINICAL DATA:  Ureteral stone 2 days ago with pain and sepsis. Check for persistent left-sided hydronephrosis. EXAM: RENAL / URINARY TRACT ULTRASOUND COMPLETE COMPARISON:  03/28/2017 CT FINDINGS: Right Kidney: Length: 10 cm. Echogenicity within normal limits. No mass or hydronephrosis visualized. Left Kidney: Length: 11.1 cm. Echogenicity within normal limits. Moderate left-sided hydronephrosis and hydroureter is noted. Bladder: Although  there are bilateral ureteral jets demonstrated within the bladder, the left side is far weaker than on the right possibly from ureteral edema or partial obstruction from a calculus. IMPRESSION: Moderate left-sided hydroureteronephrosis is present. Weak left ureteral jet stream suggests either the presence of ureteral edema or partially occlusive stone. Findings were discussed by telephone at the time of interpretation on 03/30/2017 at 6:00 pm with Dr. Lajean Saver , who verbally acknowledged these results. Electronically Signed   By: Ashley Royalty M.D.   On: 03/30/2017 18:00   Dg Chest Port 1 View  Result Date: 03/30/2017 CLINICAL DATA:  Fever and lethargy EXAM: PORTABLE CHEST 1 VIEW COMPARISON:  None. FINDINGS: The heart size and mediastinal contours are within normal limits. Both lungs are clear. The visualized skeletal structures are unremarkable. IMPRESSION: No active disease. Electronically Signed   By: Ashley Royalty M.D.   On: 03/30/2017 15:02   Dg C-arm 1-60 Min-no Report  Result Date: 03/30/2017 Fluoroscopy was utilized by the requesting physician.  No radiographic interpretation.     STUDIES:  Renal US 5/30 > moderate left sided hydroureteronephrosis.  CULTURES: Urine 5/30 >  Blood 5/30 >   ANTIBIOTICS: Vanc 5/30 > 5/31 Ceftriaxone 5/30 >   SIGNIFICANT EVENTS: 5/30 > admit.  LINES/TUBES: Left ureteral stent 5/30 >   DISCUSSION: 65 y.o. female admitted 5/30 with left PUJ stone with resultant hydroureteronephrosis.  Taken to OR for cystoscopy and placement of left ureteral stent.  Following OR, remained hypotensive so left on neosynephrine.  PCCM asked to admit.  ASSESSMENT / PLAN:  CARDIOVASCULAR A:  Septic shock - 2/2 UTI.   Demand ischemia Hx HTN. P:  Continue neosynephrine as needed to maintain goal MAP > 65. Will need echo Hold preadmission amlodipine.   INFECTIOUS A:   Septic shock 2/2 UTI, likely 2/2 Ecoli P:   Abx as above (rocephin). Will d/c vanc Follow  cultures as above.   PULMONARY A: No acute issues. P:   No interventions required.   RENAL A:   AKI - 2/2  hydroureteronephrosis from left UPJ stone.  Now s/p left ureteral stent. P:   LR at 50 mls/hr >> wean off IVF once taking more diet Continue flomax.   GASTROINTESTINAL A:   Nutrition. P:   Advance diet as tolerated.   HEMATOLOGIC A:   Thrombocytopenia. VTE Prophylaxis. P:  Monitor platelet counts. SCD's / heparin. CBC in AM.   ENDOCRINE A:   No acute issues.   P:   No interventions required.   NEUROLOGIC A:   No acute issues. P:   No interventions required.  Family updated: Daughter updated bedside.  Interdisciplinary Family Meeting v Palliative Care Meeting:  Due by: 04/05/17.  CC time: 30 min.  Monica Becton, MD 03/31/2017, 10:19 AM  Pulmonary and Critical Care Pager [phone] After 3 pm or if no answer, call [phone]

## 2017-03-31 NOTE — Progress Notes (Signed)
CRITICAL VALUE ALERT  Critical Value:  Troponin 0.61, LA=3.5  Date & Time Notied:  1031 03/30/17  Provider Notified: Dr. Ashok Cordia  Orders Received/Actions taken: Continue to monitor, order placed for Echo.

## 2017-03-31 NOTE — Progress Notes (Signed)
PHARMACY - PHYSICIAN COMMUNICATION CRITICAL VALUE ALERT - BLOOD CULTURE IDENTIFICATION (BCID)  Results for orders placed or performed during the hospital encounter of 03/30/17  Blood Culture ID Panel (Reflexed) (Collected: 03/30/2017  2:46 PM)  Result Value Ref Range   Enterococcus species NOT DETECTED NOT DETECTED   Listeria monocytogenes NOT DETECTED NOT DETECTED   Staphylococcus species NOT DETECTED NOT DETECTED   Staphylococcus aureus NOT DETECTED NOT DETECTED   Streptococcus species NOT DETECTED NOT DETECTED   Streptococcus agalactiae NOT DETECTED NOT DETECTED   Streptococcus pneumoniae NOT DETECTED NOT DETECTED   Streptococcus pyogenes NOT DETECTED NOT DETECTED   Acinetobacter baumannii NOT DETECTED NOT DETECTED   Enterobacteriaceae species DETECTED (A) NOT DETECTED   Enterobacter cloacae complex NOT DETECTED NOT DETECTED   Escherichia coli DETECTED (A) NOT DETECTED   Klebsiella oxytoca NOT DETECTED NOT DETECTED   Klebsiella pneumoniae NOT DETECTED NOT DETECTED   Proteus species NOT DETECTED NOT DETECTED   Serratia marcescens NOT DETECTED NOT DETECTED   Carbapenem resistance NOT DETECTED NOT DETECTED   Haemophilus influenzae NOT DETECTED NOT DETECTED   Neisseria meningitidis NOT DETECTED NOT DETECTED   Pseudomonas aeruginosa NOT DETECTED NOT DETECTED   Candida albicans NOT DETECTED NOT DETECTED   Candida glabrata NOT DETECTED NOT DETECTED   Candida krusei NOT DETECTED NOT DETECTED   Candida parapsilosis NOT DETECTED NOT DETECTED   Candida tropicalis NOT DETECTED NOT DETECTED    Name of physician (or Provider) Contacted:  S[place]   Changes to prescribed antibiotics required: Increased Rocephin to 2 gm IV q24h  Dorrene German 03/31/2017  5:07 AM

## 2017-04-01 ENCOUNTER — Encounter (HOSPITAL_COMMUNITY): Payer: Self-pay | Admitting: Urology

## 2017-04-01 ENCOUNTER — Inpatient Hospital Stay (HOSPITAL_COMMUNITY): Payer: Self-pay

## 2017-04-01 DIAGNOSIS — R7989 Other specified abnormal findings of blood chemistry: Secondary | ICD-10-CM

## 2017-04-01 HISTORY — PX: TRANSTHORACIC ECHOCARDIOGRAM: SHX275

## 2017-04-01 LAB — BASIC METABOLIC PANEL
Anion gap: 7 (ref 5–15)
BUN: 21 mg/dL — ABNORMAL HIGH (ref 6–20)
CHLORIDE: 114 mmol/L — AB (ref 101–111)
CO2: 19 mmol/L — ABNORMAL LOW (ref 22–32)
Calcium: 8 mg/dL — ABNORMAL LOW (ref 8.9–10.3)
Creatinine, Ser: 1.22 mg/dL — ABNORMAL HIGH (ref 0.44–1.00)
GFR, EST AFRICAN AMERICAN: 53 mL/min — AB (ref >60)
GFR, EST NON AFRICAN AMERICAN: 46 mL/min — AB (ref >60)
Glucose, Bld: 86 mg/dL (ref 65–99)
POTASSIUM: 4.1 mmol/L (ref 3.5–5.1)
SODIUM: 140 mmol/L (ref 135–145)

## 2017-04-01 LAB — CBC WITH DIFFERENTIAL/PLATELET
Basophils Absolute: 0 10*3/uL (ref 0.0–0.1)
Basophils Relative: 0 %
Eosinophils Absolute: 0 10*3/uL (ref 0.0–0.7)
Eosinophils Relative: 0 %
HCT: 34 % — ABNORMAL LOW (ref 36.0–46.0)
HEMOGLOBIN: 11.5 g/dL — AB (ref 12.0–15.0)
LYMPHS PCT: 7 %
Lymphs Abs: 0.9 10*3/uL (ref 0.7–4.0)
MCH: 27.8 pg (ref 26.0–34.0)
MCHC: 33.8 g/dL (ref 30.0–36.0)
MCV: 82.1 fL (ref 78.0–100.0)
MONOS PCT: 5 %
Monocytes Absolute: 0.7 10*3/uL (ref 0.1–1.0)
NEUTROS PCT: 88 %
Neutro Abs: 11.5 10*3/uL — ABNORMAL HIGH (ref 1.7–7.7)
PLATELETS: 85 10*3/uL — AB (ref 150–400)
RBC: 4.14 MIL/uL (ref 3.87–5.11)
RDW: 14.2 % (ref 11.5–15.5)
WBC: 13.1 10*3/uL — AB (ref 4.0–10.5)

## 2017-04-01 LAB — PROCALCITONIN: Procalcitonin: 30.45 ng/mL

## 2017-04-01 LAB — URINE CULTURE: Culture: 60000 — AB

## 2017-04-01 LAB — ECHOCARDIOGRAM COMPLETE
HEIGHTINCHES: 65 in
WEIGHTICAEL: 2356.28 [oz_av]

## 2017-04-01 LAB — CALCIUM, IONIZED: CALCIUM, IONIZED, SERUM: 3.9 mg/dL — AB (ref 4.5–5.6)

## 2017-04-01 MED ORDER — SALINE SPRAY 0.65 % NA SOLN
1.0000 | NASAL | Status: DC | PRN
  Administered 2017-04-01: 1 via NASAL
  Filled 2017-04-01: qty 44

## 2017-04-01 NOTE — Progress Notes (Signed)
PULMONARY / CRITICAL CARE MEDICINE   Name: Wendy Walsh MRN: [id number] DOB: 1951/04/23    ADMISSION DATE:  03/30/2017 CONSULTATION DATE:  03/30/17  REFERRING MD:  Junious Silk  CHIEF COMPLAINT:  Left flank pain  HISTORY OF PRESENT ILLNESS:   Wendy Walsh is a 66 y.o. female with PMH as outlined below. She presented to [facility] ED 5/28 with left flank pain and LLQ pain x 2 days.  CT scan demonstrated 5.5 x 36mm left PUJ stone.  She was discharged home with pain meds and instructions to f/u with urology. On 03/30/17, she had persistent pain, fever with Tm 105, nausea, lightheadedness, weakness; therefore, she presented back to ED.  Renal US demonstrated hydroureteronephrosis.  Urology was consulted and pt was taken to OR for cystoscopy with left retrograde pyelogram and left ureteral stent placement.  Following the procedure, she remained hypotensive despite IVF's.  Neosynephrine was continued and PCCM was asked to admit to ICU.   SUBJECTIVE:  Neosynephrine weaned off. BP better. Soft stools/watery stools last night and this am.  Comfortable Less abd pain. (-) nausea/SOB/cp   VITAL SIGNS: BP 117/64 (BP Location: Left Arm)   Pulse 69   Temp 97.6 F (36.4 C) (Oral)   Resp (!) 22   Ht 5\' 5"  (1.651 m)   Wt 66.8 kg (147 lb 4.3 oz)   SpO2 100%   BMI 24.51 kg/m   HEMODYNAMICS:    VENTILATOR SETTINGS:    INTAKE / OUTPUT: I/O last 3 completed shifts: In: 4264.1 [P.O.:60; I.V.:4154.1; IV Piggyback:50] Out: 2376 [Urine:3075]   PHYSICAL EXAMINATION: General: Well appearing, NAD Neuro: Alert and interactive through a translator HEENT: [place]/AT, PERRL, EOM-I and MMM Cardiovascular: RRR, Nl S1/S2, -M/R/G. Lungs: good ae. some crackles at bases Abdomen: Soft, NT, ND and +BS Musculoskeletal: -edema and -tenderness Skin: Intact  LABS:  BMET  Recent Labs Lab 03/30/17 1446 03/31/17 0221 04/01/17 0345  NA 138 142 140  K 4.3 4.9 4.1  CL 108 115* 114*  CO2 19* 20* 19*  BUN 32*  25* 21*  CREATININE 3.00* 1.82* 1.22*  GLUCOSE 160* 88 86    Electrolytes  Recent Labs Lab 03/30/17 1446 03/31/17 0221 04/01/17 0345  CALCIUM 8.2* 7.2* 8.0*  MG  --  1.6*  --   PHOS  --  3.7  --     CBC  Recent Labs Lab 03/30/17 1446 03/31/17 0221 04/01/17 0345  WBC 3.3* 12.0* 13.1*  HGB 12.1 11.2* 11.5*  HCT 37.8 34.6* 34.0*  PLT 108* 75* 85*    Coag's No results for input(s): APTT, INR in the last 168 hours.  Sepsis Markers  Recent Labs Lab 03/30/17 1756 03/30/17 2107 03/31/17 0006 03/31/17 0221 04/01/17 0345  LATICACIDVEN 2.91* 3.5* 1.9  --   --   PROCALCITON  --  49.36  --  48.10 30.45    ABG No results for input(s): PHART, PCO2ART, PO2ART in the last 168 hours.  Liver Enzymes  Recent Labs Lab 03/28/17 0052 03/30/17 1446  AST 27 45*  ALT 18 26  ALKPHOS 70 113  BILITOT 1.1 1.4*  ALBUMIN 3.8 3.1*    Cardiac Enzymes  Recent Labs Lab 03/30/17 2107 03/31/17 0221 03/31/17 0736  TROPONINI 0.61* 0.67* 0.69*    Glucose No results for input(s): GLUCAP in the last 168 hours.  Imaging No results found.   STUDIES:  Renal US 5/30 > moderate left sided hydroureteronephrosis.  CULTURES: Urine 5/30 > Ecoli Blood 5/30 > Ecoli  ANTIBIOTICS: Vanc 5/30 > 5/31  Ceftriaxone 5/30 >   SIGNIFICANT EVENTS: 5/30 > admit.  LINES/TUBES: Left ureteral stent 5/30 >   DISCUSSION: 66 y.o. female admitted 5/30 with left PUJ stone with resultant hydroureteronephrosis.  Taken to OR for cystoscopy and placement of left ureteral stent.  Following OR, remained hypotensive so left on neosynephrine.  PCCM asked to admit.  ASSESSMENT / PLAN:  CARDIOVASCULAR A:  S/P Septic shock - 2/2 UTI (Ecoli).  Demand ischemia Hx HTN. P:  Neosynephrine has been weaned off.  Keep IVF at LR 50 mls / hr 2/2 diarrhea.  Anticipate we can d/c IVF in am if diarrhea is better.  F/u echo Hold preadmission amlodipine.   INFECTIOUS A:   Septic shock 2/2 E coli UTI  and bacteremia P:   Abx as above (rocephin).   PULMONARY A: No acute issues. P:   No interventions required.   RENAL A:   AKI - 2/2  hydroureteronephrosis from left UPJ stone.  Now s/p left ureteral stent. Improved P:   LR at 50 mls/hr (keeping IVF 2/2 diarrhea) Continue flomax.   GASTROINTESTINAL A:   Nutrition. Diarrhea P:   Cont PO diet Observe diarrhea for now   HEMATOLOGIC A:   Thrombocytopenia. VTE Prophylaxis. P:  Monitor platelet counts. SCD's / heparin. CBC in AM.   ENDOCRINE A:   No acute issues.   P:   No interventions required.   NEUROLOGIC A:   No acute issues. P:   No interventions required.  Family updated: Daughter updated bedside.  Interdisciplinary Family Meeting v Palliative Care Meeting:  Due by: 04/05/17.  Plan to transfer to SDU today.  TRH will be primary starting 6/2 and PCCM off on 6/2. I discussed the case with Dr. Sheran Fava.   Monica Becton, MD 04/01/2017, 10:06 AM [facility] Pager [phone] After 3 pm or if no answer, call [phone]

## 2017-04-01 NOTE — Progress Notes (Signed)
  Echocardiogram 2D Echocardiogram has been performed.  Darlina Sicilian M 04/01/2017, 9:32 AM

## 2017-04-01 NOTE — Anesthesia Postprocedure Evaluation (Signed)
Anesthesia Post Note  Patient: Wendy Walsh  Procedure(s) Performed: Procedure(s) (LRB): CYSTOSCOPY WITH STENT PLACEMENT AND RETROGRADE (Left)     Patient location during evaluation: PACU Anesthesia Type: General Level of consciousness: awake and alert Pain management: pain level controlled Vital Signs Assessment: post-procedure vital signs reviewed and stable Respiratory status: spontaneous breathing, nonlabored ventilation, respiratory function stable and patient connected to nasal cannula oxygen Cardiovascular status: blood pressure returned to baseline and stable Postop Assessment: no signs of nausea or vomiting Anesthetic complications: no    Last Vitals:  Vitals:   04/01/17 0600 04/01/17 0800  BP: (!) 106/57   Pulse: 71   Resp: (!) 24   Temp:  36.4 C    Last Pain:  Vitals:   04/01/17 0800  TempSrc: Oral  PainSc:                  [name]

## 2017-04-02 LAB — BASIC METABOLIC PANEL
ANION GAP: 6 (ref 5–15)
BUN: 19 mg/dL (ref 6–20)
CHLORIDE: 111 mmol/L (ref 101–111)
CO2: 22 mmol/L (ref 22–32)
Calcium: 7.9 mg/dL — ABNORMAL LOW (ref 8.9–10.3)
Creatinine, Ser: 0.87 mg/dL (ref 0.44–1.00)
GFR calc Af Amer: >60 mL/min (ref >60)
GLUCOSE: 90 mg/dL (ref 65–99)
POTASSIUM: 3.6 mmol/L (ref 3.5–5.1)
SODIUM: 139 mmol/L (ref 135–145)

## 2017-04-02 LAB — CBC WITH DIFFERENTIAL/PLATELET
BASOS ABS: 0 10*3/uL (ref 0.0–0.1)
Basophils Relative: 0 %
Eosinophils Absolute: 0.1 10*3/uL (ref 0.0–0.7)
Eosinophils Relative: 0 %
HCT: 32.4 % — ABNORMAL LOW (ref 36.0–46.0)
HEMOGLOBIN: 10.8 g/dL — AB (ref 12.0–15.0)
LYMPHS ABS: 1.7 10*3/uL (ref 0.7–4.0)
LYMPHS PCT: 12 %
MCH: 27.1 pg (ref 26.0–34.0)
MCHC: 33.3 g/dL (ref 30.0–36.0)
MCV: 81.4 fL (ref 78.0–100.0)
Monocytes Absolute: 0.8 10*3/uL (ref 0.1–1.0)
Monocytes Relative: 6 %
NEUTROS PCT: 82 %
Neutro Abs: 11 10*3/uL — ABNORMAL HIGH (ref 1.7–7.7)
Platelets: 92 10*3/uL — ABNORMAL LOW (ref 150–400)
RBC: 3.98 MIL/uL (ref 3.87–5.11)
RDW: 14 % (ref 11.5–15.5)
WBC: 13.5 10*3/uL — AB (ref 4.0–10.5)

## 2017-04-02 LAB — URINE CULTURE

## 2017-04-02 LAB — CULTURE, BLOOD (ROUTINE X 2): SPECIAL REQUESTS: ADEQUATE

## 2017-04-02 MED ORDER — FLUTICASONE PROPIONATE 50 MCG/ACT NA SUSP
1.0000 | Freq: Every day | NASAL | Status: DC
  Filled 2017-04-02: qty 16

## 2017-04-02 MED ORDER — AMLODIPINE BESYLATE 5 MG PO TABS
5.0000 mg | ORAL_TABLET | Freq: Every day | ORAL | Status: DC
  Administered 2017-04-02 – 2017-04-03 (×2): 5 mg via ORAL
  Filled 2017-04-02 (×2): qty 1

## 2017-04-02 NOTE — Progress Notes (Signed)
PROGRESS NOTE  Wendy Walsh WVP:[id number] DOB: 17-Sep-1951 DOA: 03/30/2017 PCP: Raelyn Number, MD   LOS: 3 days   Brief Narrative / Interim history: 66 year old with history of hypertension, was admitted on 5/30 with septic shock and obstructing left ureteral stone and UTI.  Urology was consulted and patient is status post left ureteral stent 03/30/2017.  She was in the ICU requiring pressors, and she improved, her blood pressure normalized, and TRH took over on 6/2  Assessment & Plan: Active Problems:   Septic shock (HCC)   AKI (acute kidney injury) ([place])   Left ureteral stone   Sepsis ([place])   Septic shock due to UTI/pyelonephritis in the setting of hydronephrosis and obstructing ureteral stone -Status post stent placement by urology -She is now off pressors, move out of stepdown to regular floor today -Blood cultures and urine cultures grew E. coli which is sensitive to everything except ampicillin -Obtain surveillance blood cultures, continue antibiotics  Acute kidney injury -Postobstructive, improved after IV fluids  Hypertension -More hypertensive today, resume home Norvasc  Troponin elevation -Flat, not in a pattern consistent with ACS, likely demand in the setting of septic shock, no chest pain -2D echo done 6/1 with normal ejection fraction   DVT prophylaxis: Heparin Code Status: Full code Family Communication: Family at bedside Disposition Plan: Home likely 1 day  Consultants:   Critical care  Urology  Procedures:   Cystoscopy with left retrograde pyelogram left ureteral stent placement  Antimicrobials:  Ceftriaxone    Subjective: - no chest pain, shortness of breath, no abdominal pain, nausea or vomiting.  Asking to go home  Objective: Vitals:   04/02/17 0403 04/02/17 0700 04/02/17 0800 04/02/17 0910  BP:   (!) 157/71 (!) 147/82  Pulse:  61 61 (!) 58  Resp:  (!) 25 (!) 31 16  Temp: 98.2 F (36.8 C)  97.9 F (36.6 C) 98.8 F (37.1 C)    TempSrc: Oral  Oral Oral  SpO2:  96% 98% 98%  Weight:      Height:        Intake/Output Summary (Last 24 hours) at 04/02/17 1225 Last data filed at 04/02/17 0700  Gross per 24 hour  Intake             1100 ml  Output              500 ml  Net              600 ml   Filed Weights   03/30/17 1427 03/31/17 0509 04/01/17 0600  Weight: 63.5 kg (140 lb) 63.5 kg (139 lb 15.9 oz) 66.8 kg (147 lb 4.3 oz)    Examination:  Vitals:   04/02/17 0403 04/02/17 0700 04/02/17 0800 04/02/17 0910  BP:   (!) 157/71 (!) 147/82  Pulse:  61 61 (!) 58  Resp:  (!) 25 (!) 31 16  Temp: 98.2 F (36.8 C)  97.9 F (36.6 C) 98.8 F (37.1 C)  TempSrc: Oral  Oral Oral  SpO2:  96% 98% 98%  Weight:      Height:        Constitutional: NAD Eyes: lids and conjunctivae normal Respiratory: clear to auscultation bilaterally, no wheezing, no crackles. Normal respiratory effort. No accessory muscle use.  Cardiovascular: Regular rate and rhythm, no murmurs / rubs / gallops. No LE edema. 2+ pedal pulses. No carotid bruits.  Abdomen: no tenderness. Bowel sounds positive.  Neurologic: nonfocal    Data Reviewed: I have personally reviewed  following labs and imaging studies  CBC:  Recent Labs Lab 03/28/17 0052 03/30/17 1446 03/31/17 0221 04/01/17 0345 04/02/17 0306  WBC 9.9 3.3* 12.0* 13.1* 13.5*  NEUTROABS  --  3.1  --  11.5* 11.0*  HGB 13.0 12.1 11.2* 11.5* 10.8*  HCT 39.4 37.8 34.6* 34.0* 32.4*  MCV 83.5 84.2 84.2 82.1 81.4  PLT 162 108* 75* 85* 92*   Basic Metabolic Panel:  Recent Labs Lab 03/28/17 0052 03/30/17 1446 03/31/17 0221 04/01/17 0345 04/02/17 0306  NA 140 138 142 140 139  K 3.1* 4.3 4.9 4.1 3.6  CL 107 108 115* 114* 111  CO2 25 19* 20* 19* 22  GLUCOSE 123* 160* 88 86 90  BUN 14 32* 25* 21* 19  CREATININE 0.97 3.00* 1.82* 1.22* 0.87  CALCIUM 9.0 8.2* 7.2* 8.0* 7.9*  MG  --   --  1.6*  --   --   PHOS  --   --  3.7  --   --    GFR: Estimated Creatinine Clearance: 58.8  mL/min (by C-G formula based on SCr of 0.87 mg/dL). Liver Function Tests:  Recent Labs Lab 03/28/17 0052 03/30/17 1446  AST 27 45*  ALT 18 26  ALKPHOS 70 113  BILITOT 1.1 1.4*  PROT 7.1 6.5  ALBUMIN 3.8 3.1*   No results for input(s): LIPASE, AMYLASE in the last 168 hours. No results for input(s): AMMONIA in the last 168 hours. Coagulation Profile: No results for input(s): INR, PROTIME in the last 168 hours. Cardiac Enzymes:  Recent Labs Lab 03/30/17 2107 03/31/17 0221 03/31/17 0736  TROPONINI 0.61* 0.67* 0.69*   BNP (last 3 results) No results for input(s): PROBNP in the last 8760 hours. HbA1C: No results for input(s): HGBA1C in the last 72 hours. CBG: No results for input(s): GLUCAP in the last 168 hours. Lipid Profile: No results for input(s): CHOL, HDL, LDLCALC, TRIG, CHOLHDL, LDLDIRECT in the last 72 hours. Thyroid Function Tests: No results for input(s): TSH, T4TOTAL, FREET4, T3FREE, THYROIDAB in the last 72 hours. Anemia Panel: No results for input(s): VITAMINB12, FOLATE, FERRITIN, TIBC, IRON, RETICCTPCT in the last 72 hours. Urine analysis:    Component Value Date/Time   COLORURINE YELLOW 03/30/2017 1643   APPEARANCEUR CLOUDY (A) 03/30/2017 1643   LABSPEC 1.013 03/30/2017 1643   PHURINE 5.0 03/30/2017 1643   GLUCOSEU NEGATIVE 03/30/2017 1643   HGBUR MODERATE (A) 03/30/2017 1643   BILIRUBINUR NEGATIVE 03/30/2017 [place] 03/30/2017 1643   PROTEINUR 100 (A) 03/30/2017 1643   NITRITE POSITIVE (A) 03/30/2017 1643   LEUKOCYTESUR LARGE (A) 03/30/2017 1643   Sepsis Labs: Invalid input(s): PROCALCITONIN, LACTICIDVEN  Recent Results (from the past 240 hour(s))  Blood Culture (routine x 2)     Status: Abnormal   Collection Time: 03/30/17  2:46 PM  Result Value Ref Range Status   Specimen Description BLOOD RIGHT FOREARM  Final   Special Requests   Final    BOTTLES DRAWN AEROBIC AND ANAEROBIC Blood Culture adequate volume   Culture  Setup  Time   Final    IN BOTH AEROBIC AND ANAEROBIC BOTTLES GRAM NEGATIVE RODS CRITICAL RESULT CALLED TO, READ BACK BY AND VERIFIED WITH: TO BGREEN(PHAMd) BY TCLEVELAND 03/31/2017 AT 5:02AM Performed at [facility], [place] [address] [place], [place] [address]    Culture ESCHERICHIA COLI (A)  Final   Report Status 04/02/2017 FINAL  Final   Organism ID, Bacteria ESCHERICHIA COLI  Final      Susceptibility  Escherichia coli - MIC*    AMPICILLIN >=32 RESISTANT Resistant     CEFAZOLIN <=4 SENSITIVE Sensitive     CEFEPIME <=1 SENSITIVE Sensitive     CEFTAZIDIME <=1 SENSITIVE Sensitive     CEFTRIAXONE <=1 SENSITIVE Sensitive     CIPROFLOXACIN <=0.25 SENSITIVE Sensitive     GENTAMICIN <=1 SENSITIVE Sensitive     IMIPENEM <=0.25 SENSITIVE Sensitive     TRIMETH/SULFA >=320 RESISTANT Resistant     AMPICILLIN/SULBACTAM 16 INTERMEDIATE Intermediate     PIP/TAZO <=4 SENSITIVE Sensitive     Extended ESBL NEGATIVE Sensitive     * ESCHERICHIA COLI  Blood Culture ID Panel (Reflexed)     Status: Abnormal   Collection Time: 03/30/17  2:46 PM  Result Value Ref Range Status   Enterococcus species NOT DETECTED NOT DETECTED Final   Listeria monocytogenes NOT DETECTED NOT DETECTED Final   Staphylococcus species NOT DETECTED NOT DETECTED Final   Staphylococcus aureus NOT DETECTED NOT DETECTED Final   Streptococcus species NOT DETECTED NOT DETECTED Final   Streptococcus agalactiae NOT DETECTED NOT DETECTED Final   Streptococcus pneumoniae NOT DETECTED NOT DETECTED Final   Streptococcus pyogenes NOT DETECTED NOT DETECTED Final   Acinetobacter baumannii NOT DETECTED NOT DETECTED Final   Enterobacteriaceae species DETECTED (A) NOT DETECTED Final    Comment: Enterobacteriaceae represent a large family of gram-negative bacteria, not a single organism. CRITICAL RESULT CALLED TO, READ BACK BY AND VERIFIED WITH: TO BGREEN(PHARMd) BY TCLEVELAND 03/31/2017 AT 5:02AM    Enterobacter cloacae complex NOT DETECTED NOT  DETECTED Final   Escherichia coli DETECTED (A) NOT DETECTED Final    Comment: CRITICAL RESULT CALLED TO, READ BACK BY AND VERIFIED WITH: TO BGREEN(PHARMd) BY TCLEVELAND 03/31/2017 AT 5:02AM    Klebsiella oxytoca NOT DETECTED NOT DETECTED Final   Klebsiella pneumoniae NOT DETECTED NOT DETECTED Final   Proteus species NOT DETECTED NOT DETECTED Final   Serratia marcescens NOT DETECTED NOT DETECTED Final   Carbapenem resistance NOT DETECTED NOT DETECTED Final   Haemophilus influenzae NOT DETECTED NOT DETECTED Final   Neisseria meningitidis NOT DETECTED NOT DETECTED Final   Pseudomonas aeruginosa NOT DETECTED NOT DETECTED Final   Candida albicans NOT DETECTED NOT DETECTED Final   Candida glabrata NOT DETECTED NOT DETECTED Final   Candida krusei NOT DETECTED NOT DETECTED Final   Candida parapsilosis NOT DETECTED NOT DETECTED Final   Candida tropicalis NOT DETECTED NOT DETECTED Final    Comment: Performed at [facility], [place]. [address] [place], [place] [address]  Urine culture     Status: Abnormal   Collection Time: 03/30/17  4:43 PM  Result Value Ref Range Status   Specimen Description URINE, CLEAN CATCH  Final   Special Requests NONE  Final   Culture 60,000 COLONIES/mL ESCHERICHIA COLI (A)  Final   Report Status 04/01/2017 FINAL  Final   Organism ID, Bacteria ESCHERICHIA COLI (A)  Final      Susceptibility   Escherichia coli - MIC*    AMPICILLIN >=32 RESISTANT Resistant     CEFAZOLIN <=4 SENSITIVE Sensitive     CEFTRIAXONE <=1 SENSITIVE Sensitive     CIPROFLOXACIN <=0.25 SENSITIVE Sensitive     GENTAMICIN <=1 SENSITIVE Sensitive     IMIPENEM <=0.25 SENSITIVE Sensitive     NITROFURANTOIN 64 INTERMEDIATE Intermediate     TRIMETH/SULFA >=320 RESISTANT Resistant     AMPICILLIN/SULBACTAM 16 INTERMEDIATE Intermediate     PIP/TAZO <=4 SENSITIVE Sensitive     Extended ESBL NEGATIVE Sensitive     *  60,000 COLONIES/mL ESCHERICHIA COLI  Urine culture     Status: Abnormal   Collection  Time: 03/30/17  7:07 PM  Result Value Ref Range Status   Specimen Description CYSTOSCOPY LEFT KIDNEY  Final   Special Requests NONE  Final   Culture 50,000 COLONIES/mL ESCHERICHIA COLI (A)  Final   Report Status 04/02/2017 FINAL  Final   Organism ID, Bacteria ESCHERICHIA COLI (A)  Final      Susceptibility   Escherichia coli - MIC*    AMPICILLIN >=32 RESISTANT Resistant     CEFAZOLIN <=4 SENSITIVE Sensitive     CEFEPIME <=1 SENSITIVE Sensitive     CEFTAZIDIME <=1 SENSITIVE Sensitive     CEFTRIAXONE <=1 SENSITIVE Sensitive     CIPROFLOXACIN <=0.25 SENSITIVE Sensitive     GENTAMICIN <=1 SENSITIVE Sensitive     IMIPENEM <=0.25 SENSITIVE Sensitive     TRIMETH/SULFA >=320 RESISTANT Resistant     AMPICILLIN/SULBACTAM 16 INTERMEDIATE Intermediate     PIP/TAZO <=4 SENSITIVE Sensitive     Extended ESBL NEGATIVE Sensitive     * 50,000 COLONIES/mL ESCHERICHIA COLI  MRSA PCR Screening     Status: None   Collection Time: 03/30/17  8:53 PM  Result Value Ref Range Status   MRSA by PCR NEGATIVE NEGATIVE Final    Comment:        The GeneXpert MRSA Assay (FDA approved for NASAL specimens only), is one component of a comprehensive MRSA colonization surveillance program. It is not intended to diagnose MRSA infection nor to guide or monitor treatment for MRSA infections.   Blood Culture (routine x 2)     Status: None (Preliminary result)   Collection Time: 03/30/17  9:07 PM  Result Value Ref Range Status   Specimen Description BLOOD LEFT HAND  Final   Special Requests IN PEDIATRIC BOTTLE Blood Culture adequate volume  Final   Culture   Final    NO GROWTH 2 DAYS Performed at [facility], [place] [address], [place],  [address]    Report Status PENDING  Incomplete      Radiology Studies: No results found.   Scheduled Meds: . amLODipine  5 mg Oral Daily  . heparin  5,000 Units Subcutaneous Q8H  . tamsulosin  0.4 mg Oral Daily   Continuous Infusions: . sodium chloride      . cefTRIAXone (ROCEPHIN)  IV Stopped (04/01/17 1426)  . lactated ringers 50 mL/hr at 04/02/17 0700     Marzetta Board, MD, PhD [facility] Pager [phone]  If 7PM-7AM, please contact night-coverage [email or web address] Password [facility] 04/02/2017, 12:25 PM

## 2017-04-02 NOTE — Progress Notes (Signed)
PT transferred from ICU. Vital signs stable. No change from previous assessment. Denies pain or discomfort. Will continue to monitor.

## 2017-04-03 DIAGNOSIS — B9620 Unspecified Escherichia coli [E. coli] as the cause of diseases classified elsewhere: Secondary | ICD-10-CM

## 2017-04-03 DIAGNOSIS — R7881 Bacteremia: Secondary | ICD-10-CM

## 2017-04-03 LAB — CBC WITH DIFFERENTIAL/PLATELET
Basophils Absolute: 0 10*3/uL (ref 0.0–0.1)
Basophils Relative: 0 %
Eosinophils Absolute: 0.1 10*3/uL (ref 0.0–0.7)
Eosinophils Relative: 1 %
HEMATOCRIT: 31.4 % — AB (ref 36.0–46.0)
HEMOGLOBIN: 10.7 g/dL — AB (ref 12.0–15.0)
LYMPHS ABS: 2.3 10*3/uL (ref 0.7–4.0)
LYMPHS PCT: 21 %
MCH: 27.2 pg (ref 26.0–34.0)
MCHC: 34.1 g/dL (ref 30.0–36.0)
MCV: 79.9 fL (ref 78.0–100.0)
MONOS PCT: 11 %
Monocytes Absolute: 1.2 10*3/uL — ABNORMAL HIGH (ref 0.1–1.0)
Neutro Abs: 7.5 10*3/uL (ref 1.7–7.7)
Neutrophils Relative %: 67 %
Platelets: 106 10*3/uL — ABNORMAL LOW (ref 150–400)
RBC: 3.93 MIL/uL (ref 3.87–5.11)
RDW: 13.4 % (ref 11.5–15.5)
WBC: 11.1 10*3/uL — AB (ref 4.0–10.5)

## 2017-04-03 LAB — BASIC METABOLIC PANEL
Anion gap: 6 (ref 5–15)
BUN: 13 mg/dL (ref 6–20)
CO2: 25 mmol/L (ref 22–32)
Calcium: 8.1 mg/dL — ABNORMAL LOW (ref 8.9–10.3)
Chloride: 110 mmol/L (ref 101–111)
Creatinine, Ser: 0.78 mg/dL (ref 0.44–1.00)
GFR calc non Af Amer: >60 mL/min (ref >60)
Glucose, Bld: 94 mg/dL (ref 65–99)
POTASSIUM: 3.4 mmol/L — AB (ref 3.5–5.1)
SODIUM: 141 mmol/L (ref 135–145)

## 2017-04-03 MED ORDER — CIPROFLOXACIN HCL 500 MG PO TABS: 500.0000 mg | ORAL_TABLET | Freq: Two times a day (BID) | ORAL | 0 refills | Status: AC

## 2017-04-03 NOTE — Discharge Summary (Signed)
Physician Discharge Summary  Zanyla Klebba YSA:[id number] DOB: 29-Aug-1951 DOA: 03/30/2017  PCP: Raelyn Number, MD  Admit date: 03/30/2017 Discharge date: 04/03/2017  Admitted From: Home Disposition: Home  Recommendations for Outpatient Follow-up:  1. Follow up with Dr. Junious Silk in 2 weeks 2. Continue ciprofloxacin for 11 more days to complete a two-week course  Home Health: None Equipment/Devices: None  Discharge Condition: Stable CODE STATUS: Full code Diet recommendation: Regular  HPI: Per Dr. Jerilynn Mages Donahey is a 66 y.o. female with PMH as outlined below. She presented to [facility] ED 5/28 with left flank pain and LLQ pain x 2 days.  CT scan demonstrated 5.5 x 59mm left PUJ stone.  She was discharged home with pain meds and instructions to f/u with urology. On 03/30/17, she had persistent pain, fever with Tm 105, nausea, lightheadedness, weakness; therefore, she presented back to ED. Renal US demonstrated hydroureteronephrosis.  Urology was consulted and pt was taken to OR for cystoscopy with left retrograde pyelogram and left ureteral stent placement.  Following the procedure, she remained hypotensive despite IVF's.  Neosynephrine was continued and PCCM was asked to admit to ICU.  Hospital Course: Discharge Diagnoses:  Active Problems:   Septic shock (HCC)   AKI (acute kidney injury) ([place])   Left ureteral stone   E coli bacteremia   Septic shock due to E. coli UTI and E. coli bacteremia / pyelonephritis in the setting of hydronephrosis and obstructing ureteral stone -patient was admitted to ICU in septic shock due to pyelonephritis and obstructing ureteral stone.  She was placed on IV antibiotics, pressors, urology was consulted and emergently to the patient to the operating room for stent placement.  Following that, she  defervesced, she was able to be weaned off of pressors and she was eventually transferred to the hospitalist service on 6/2.  She remained stable, her sepsis  physiology has resolved, her white count has been improving, and she was transitioned to ciprofloxacin for sensitivities.  She will have follow-up with urology as an outpatient in couple weeks Acute kidney injury -likely in the setting of septic shock in the postobstructive component, improved with IV fluids and creatinine has normalized on discharge Hypertension -resume home medications on discharge Troponin elevation -flat, not in a pattern consistent with ACS, likely demand the setting of septic shock.  She has no chest pain.  2D echo done 6/1 with normal ejection fraction and no wall motion abnormalities  Discharge Instructions   Allergies as of 04/03/2017   Not on File     Medication List    STOP taking these medications   oxyCODONE-acetaminophen 5-325 MG tablet Commonly known as:  PERCOCET/ROXICET   promethazine 25 MG tablet Commonly known as:  PHENERGAN     TAKE these medications   amLODipine 5 MG tablet Commonly known as:  NORVASC Take 5 mg by mouth daily.   ciprofloxacin 500 MG tablet Commonly known as:  CIPRO Take 1 tablet (500 mg total) by mouth 2 (two) times daily.   ibuprofen 600 MG tablet Commonly known as:  ADVIL,MOTRIN Take 1 tablet (600 mg total) by mouth every 6 (six) hours as needed. What changed:  reasons to take this   tamsulosin 0.4 MG Caps capsule Commonly known as:  FLOMAX Take 1 capsule (0.4 mg total) by mouth daily.      Follow-up Information    Festus Aloe, MD. Schedule an appointment as soon as possible for a visit in 2 week(s).   Specialty:  Urology Contact  information: [place] [address] [phone]          Not on File  Consultations:  Critical CARE  Urology  Procedures/Studies:  2D echo  Impressions: - Technically difficult study with poor acoustic windows. Definity contrast probably would have helped but was not used. Normal LV size with EF 55-60%. Normal RV size and systolic function. Mild  pulmonary hypertension. Dilated IVC suggests elevated RV filling pressure.  Dg Abd 1 View  Result Date: 03/30/2017 CLINICAL DATA:  66 y/o  F; fever and recent left kidney stone. EXAM: ABDOMEN - 1 VIEW COMPARISON:  03/28/2017 CT of the abdomen and pelvis. FINDINGS: The bowel gas pattern is normal. No radio-opaque calculi or other significant radiographic abnormality are seen. IMPRESSION: Negative. Left ureteral stone on prior CT of abdomen and pelvis not identified. Electronically Signed   By: Kristine Garbe M.D.   On: 03/30/2017 17:14   US Renal  Result Date: 03/30/2017 CLINICAL DATA:  Ureteral stone 2 days ago with pain and sepsis. Check for persistent left-sided hydronephrosis. EXAM: RENAL / URINARY TRACT ULTRASOUND COMPLETE COMPARISON:  03/28/2017 CT FINDINGS: Right Kidney: Length: 10 cm. Echogenicity within normal limits. No mass or hydronephrosis visualized. Left Kidney: Length: 11.1 cm. Echogenicity within normal limits. Moderate left-sided hydronephrosis and hydroureter is noted. Bladder: Although there are bilateral ureteral jets demonstrated within the bladder, the left side is far weaker than on the right possibly from ureteral edema or partial obstruction from a calculus. IMPRESSION: Moderate left-sided hydroureteronephrosis is present. Weak left ureteral jet stream suggests either the presence of ureteral edema or partially occlusive stone. Findings were discussed by telephone at the time of interpretation on 03/30/2017 at 6:00 pm with Dr. Lajean Saver , who verbally acknowledged these results. Electronically Signed   By: Ashley Royalty M.D.   On: 03/30/2017 18:00   Dg Chest Port 1 View  Result Date: 03/30/2017 CLINICAL DATA:  Fever and lethargy EXAM: PORTABLE CHEST 1 VIEW COMPARISON:  None. FINDINGS: The heart size and mediastinal contours are within normal limits. Both lungs are clear. The visualized skeletal structures are unremarkable. IMPRESSION: No active disease. Electronically  Signed   By: Ashley Royalty M.D.   On: 03/30/2017 15:02   Dg C-arm 1-60 Min-no Report  Result Date: 03/30/2017 Fluoroscopy was utilized by the requesting physician.  No radiographic interpretation.   Ct Renal Stone Study  Result Date: 03/28/2017 CLINICAL DATA:  Acute onset of left flank pain, radiating to the left lower quadrant. Chills, nausea and vomiting. Red blood cells and white blood cells in the urine. Initial encounter. EXAM: CT ABDOMEN AND PELVIS WITHOUT CONTRAST TECHNIQUE: Multidetector CT imaging of the abdomen and pelvis was performed following the standard protocol without IV contrast. COMPARISON:  None. FINDINGS: Lower chest: Minimal bibasilar atelectasis is noted. The visualized portions of the mediastinum are unremarkable. Hepatobiliary: The liver is unremarkable in appearance. The gallbladder is unremarkable in appearance. The common bile duct remains normal in caliber. Pancreas: The pancreas is within normal limits. Spleen: The spleen is unremarkable in appearance. Adrenals/Urinary Tract: The adrenal glands are unremarkable in appearance. There is minimal left-sided hydronephrosis, with prominence of the proximal left ureter. An obstructing 6 x 3 mm stone is noted at the left mid ureter. Left-sided perinephric stranding is noted. The right kidney is unremarkable in appearance. Stomach/Bowel: The stomach is unremarkable in appearance. The small bowel is within normal limits. The appendix is normal in caliber, without evidence of appendicitis. The colon is unremarkable in appearance.  Vascular/Lymphatic: Minimal calcification is seen in the mid abdominal aorta. No retroperitoneal or pelvic sidewall lymphadenopathy is seen. Reproductive: The bladder is mildly distended and grossly unremarkable in appearance. The patient is status post hysterectomy. No suspicious adnexal masses are seen. Other: No additional soft tissue abnormalities are seen. Musculoskeletal: No acute osseous abnormalities are  identified. The visualized musculature is unremarkable in appearance. IMPRESSION: Minimal left-sided hydronephrosis, with an obstructing 6 x 3 mm stone at the left mid ureter. Electronically Signed   By: Garald Balding M.D.   On: 03/28/2017 02:39     Subjective: - no chest pain, shortness of breath, no abdominal pain, nausea or vomiting.   Discharge Exam: Vitals:   04/02/17 2048 04/03/17 0628  BP: 133/64 136/72  Pulse: 73 (!) 56  Resp: 18 18  Temp: 98.6 F (37 C)    Vitals:   04/02/17 0910 04/02/17 1422 04/02/17 2048 04/03/17 0628  BP: (!) 147/82 131/79 133/64 136/72  Pulse: (!) 58 64 73 (!) 56  Resp: 16 18 18 18   Temp: 98.8 F (37.1 C) 98.3 F (36.8 C) 98.6 F (37 C)   TempSrc: Oral Oral Oral Oral  SpO2: 98% 97% 96% 97%  Weight:    67 kg (147 lb 11.3 oz)  Height:        General: Pt is alert, awake, not in acute distress Cardiovascular: RRR, S1/S2 +, no rubs, no gallops Respiratory: CTA bilaterally, no wheezing, no rhonchi Abdominal: Soft, NT, ND, bowel sounds + Extremities: no edema, no cyanosis    The results of significant diagnostics from this hospitalization (including imaging, microbiology, ancillary and laboratory) are listed below for reference.     Microbiology: Recent Results (from the past 240 hour(s))  Blood Culture (routine x 2)     Status: Abnormal   Collection Time: 03/30/17  2:46 PM  Result Value Ref Range Status   Specimen Description BLOOD RIGHT FOREARM  Final   Special Requests   Final    BOTTLES DRAWN AEROBIC AND ANAEROBIC Blood Culture adequate volume   Culture  Setup Time   Final    IN BOTH AEROBIC AND ANAEROBIC BOTTLES GRAM NEGATIVE RODS CRITICAL RESULT CALLED TO, READ BACK BY AND VERIFIED WITH: TO BGREEN(PHAMd) BY TCLEVELAND 03/31/2017 AT 5:02AM Performed at [facility], [address], [place], [place] [address]    Culture ESCHERICHIA COLI (A)  Final   Report Status 04/02/2017 FINAL  Final   Organism ID, Bacteria ESCHERICHIA COLI   Final      Susceptibility   Escherichia coli - MIC*    AMPICILLIN >=32 RESISTANT Resistant     CEFAZOLIN <=4 SENSITIVE Sensitive     CEFEPIME <=1 SENSITIVE Sensitive     CEFTAZIDIME <=1 SENSITIVE Sensitive     CEFTRIAXONE <=1 SENSITIVE Sensitive     CIPROFLOXACIN <=0.25 SENSITIVE Sensitive     GENTAMICIN <=1 SENSITIVE Sensitive     IMIPENEM <=0.25 SENSITIVE Sensitive     TRIMETH/SULFA >=320 RESISTANT Resistant     AMPICILLIN/SULBACTAM 16 INTERMEDIATE Intermediate     PIP/TAZO <=4 SENSITIVE Sensitive     Extended ESBL NEGATIVE Sensitive     * ESCHERICHIA COLI  Blood Culture ID Panel (Reflexed)     Status: Abnormal   Collection Time: 03/30/17  2:46 PM  Result Value Ref Range Status   Enterococcus species NOT DETECTED NOT DETECTED Final   Listeria monocytogenes NOT DETECTED NOT DETECTED Final   Staphylococcus species NOT DETECTED NOT DETECTED Final   Staphylococcus aureus NOT DETECTED  NOT DETECTED Final   Streptococcus species NOT DETECTED NOT DETECTED Final   Streptococcus agalactiae NOT DETECTED NOT DETECTED Final   Streptococcus pneumoniae NOT DETECTED NOT DETECTED Final   Streptococcus pyogenes NOT DETECTED NOT DETECTED Final   Acinetobacter baumannii NOT DETECTED NOT DETECTED Final   Enterobacteriaceae species DETECTED (A) NOT DETECTED Final    Comment: Enterobacteriaceae represent a large family of gram-negative bacteria, not a single organism. CRITICAL RESULT CALLED TO, READ BACK BY AND VERIFIED WITH: TO BGREEN(PHARMd) BY TCLEVELAND 03/31/2017 AT 5:02AM    Enterobacter cloacae complex NOT DETECTED NOT DETECTED Final   Escherichia coli DETECTED (A) NOT DETECTED Final    Comment: CRITICAL RESULT CALLED TO, READ BACK BY AND VERIFIED WITH: TO BGREEN(PHARMd) BY TCLEVELAND 03/31/2017 AT 5:02AM    Klebsiella oxytoca NOT DETECTED NOT DETECTED Final   Klebsiella pneumoniae NOT DETECTED NOT DETECTED Final   Proteus species NOT DETECTED NOT DETECTED Final   Serratia marcescens NOT  DETECTED NOT DETECTED Final   Carbapenem resistance NOT DETECTED NOT DETECTED Final   Haemophilus influenzae NOT DETECTED NOT DETECTED Final   Neisseria meningitidis NOT DETECTED NOT DETECTED Final   Pseudomonas aeruginosa NOT DETECTED NOT DETECTED Final   Candida albicans NOT DETECTED NOT DETECTED Final   Candida glabrata NOT DETECTED NOT DETECTED Final   Candida krusei NOT DETECTED NOT DETECTED Final   Candida parapsilosis NOT DETECTED NOT DETECTED Final   Candida tropicalis NOT DETECTED NOT DETECTED Final    Comment: Performed at [facility], [place]. [address], [place], [place] [address]  Urine culture     Status: Abnormal   Collection Time: 03/30/17  4:43 PM  Result Value Ref Range Status   Specimen Description URINE, CLEAN CATCH  Final   Special Requests NONE  Final   Culture 60,000 COLONIES/mL ESCHERICHIA COLI (A)  Final   Report Status 04/01/2017 FINAL  Final   Organism ID, Bacteria ESCHERICHIA COLI (A)  Final      Susceptibility   Escherichia coli - MIC*    AMPICILLIN >=32 RESISTANT Resistant     CEFAZOLIN <=4 SENSITIVE Sensitive     CEFTRIAXONE <=1 SENSITIVE Sensitive     CIPROFLOXACIN <=0.25 SENSITIVE Sensitive     GENTAMICIN <=1 SENSITIVE Sensitive     IMIPENEM <=0.25 SENSITIVE Sensitive     NITROFURANTOIN 64 INTERMEDIATE Intermediate     TRIMETH/SULFA >=320 RESISTANT Resistant     AMPICILLIN/SULBACTAM 16 INTERMEDIATE Intermediate     PIP/TAZO <=4 SENSITIVE Sensitive     Extended ESBL NEGATIVE Sensitive     * 60,000 COLONIES/mL ESCHERICHIA COLI  Urine culture     Status: Abnormal   Collection Time: 03/30/17  7:07 PM  Result Value Ref Range Status   Specimen Description CYSTOSCOPY LEFT KIDNEY  Final   Special Requests NONE  Final   Culture 50,000 COLONIES/mL ESCHERICHIA COLI (A)  Final   Report Status 04/02/2017 FINAL  Final   Organism ID, Bacteria ESCHERICHIA COLI (A)  Final      Susceptibility   Escherichia coli - MIC*    AMPICILLIN >=32 RESISTANT Resistant      CEFAZOLIN <=4 SENSITIVE Sensitive     CEFEPIME <=1 SENSITIVE Sensitive     CEFTAZIDIME <=1 SENSITIVE Sensitive     CEFTRIAXONE <=1 SENSITIVE Sensitive     CIPROFLOXACIN <=0.25 SENSITIVE Sensitive     GENTAMICIN <=1 SENSITIVE Sensitive     IMIPENEM <=0.25 SENSITIVE Sensitive     TRIMETH/SULFA >=320 RESISTANT Resistant     AMPICILLIN/SULBACTAM 16 INTERMEDIATE  Intermediate     PIP/TAZO <=4 SENSITIVE Sensitive     Extended ESBL NEGATIVE Sensitive     * 50,000 COLONIES/mL ESCHERICHIA COLI  MRSA PCR Screening     Status: None   Collection Time: 03/30/17  8:53 PM  Result Value Ref Range Status   MRSA by PCR NEGATIVE NEGATIVE Final    Comment:        The GeneXpert MRSA Assay (FDA approved for NASAL specimens only), is one component of a comprehensive MRSA colonization surveillance program. It is not intended to diagnose MRSA infection nor to guide or monitor treatment for MRSA infections.   Blood Culture (routine x 2)     Status: None (Preliminary result)   Collection Time: 03/30/17  9:07 PM  Result Value Ref Range Status   Specimen Description BLOOD LEFT HAND  Final   Special Requests IN PEDIATRIC BOTTLE Blood Culture adequate volume  Final   Culture   Final    NO GROWTH 3 DAYS Performed at [facility], [place] [address], [place], [place] [address]    Report Status PENDING  Incomplete     Labs: BNP (last 3 results) No results for input(s): BNP in the last 8760 hours. Basic Metabolic Panel:  Recent Labs Lab 03/30/17 1446 03/31/17 0221 04/01/17 0345 04/02/17 0306 04/03/17 0507  NA 138 142 140 139 141  K 4.3 4.9 4.1 3.6 3.4*  CL 108 115* 114* 111 110  CO2 19* 20* 19* 22 25  GLUCOSE 160* 88 86 90 94  BUN 32* 25* 21* 19 13  CREATININE 3.00* 1.82* 1.22* 0.87 0.78  CALCIUM 8.2* 7.2* 8.0* 7.9* 8.1*  MG  --  1.6*  --   --   --   PHOS  --  3.7  --   --   --    Liver Function Tests:  Recent Labs Lab 03/28/17 0052 03/30/17 1446  AST 27 45*  ALT 18 26    ALKPHOS 70 113  BILITOT 1.1 1.4*  PROT 7.1 6.5  ALBUMIN 3.8 3.1*   No results for input(s): LIPASE, AMYLASE in the last 168 hours. No results for input(s): AMMONIA in the last 168 hours. CBC:  Recent Labs Lab 03/30/17 1446 03/31/17 0221 04/01/17 0345 04/02/17 0306 04/03/17 0507  WBC 3.3* 12.0* 13.1* 13.5* 11.1*  NEUTROABS 3.1  --  11.5* 11.0* 7.5  HGB 12.1 11.2* 11.5* 10.8* 10.7*  HCT 37.8 34.6* 34.0* 32.4* 31.4*  MCV 84.2 84.2 82.1 81.4 79.9  PLT 108* 75* 85* 92* 106*   Cardiac Enzymes:  Recent Labs Lab 03/30/17 2107 03/31/17 0221 03/31/17 0736  TROPONINI 0.61* 0.67* 0.69*   BNP: Invalid input(s): POCBNP CBG: No results for input(s): GLUCAP in the last 168 hours. D-Dimer No results for input(s): DDIMER in the last 72 hours. Hgb A1c No results for input(s): HGBA1C in the last 72 hours. Lipid Profile No results for input(s): CHOL, HDL, LDLCALC, TRIG, CHOLHDL, LDLDIRECT in the last 72 hours. Thyroid function studies No results for input(s): TSH, T4TOTAL, T3FREE, THYROIDAB in the last 72 hours.  Invalid input(s): FREET3 Anemia work up No results for input(s): VITAMINB12, FOLATE, FERRITIN, TIBC, IRON, RETICCTPCT in the last 72 hours. Urinalysis    Component Value Date/Time   COLORURINE YELLOW 03/30/2017 1643   APPEARANCEUR CLOUDY (A) 03/30/2017 1643   LABSPEC 1.013 03/30/2017 1643   PHURINE 5.0 03/30/2017 1643   GLUCOSEU NEGATIVE 03/30/2017 1643   HGBUR MODERATE (A) 03/30/2017 1643   BILIRUBINUR NEGATIVE 03/30/2017 1643  KETONESUR NEGATIVE 03/30/2017 1643   PROTEINUR 100 (A) 03/30/2017 1643   NITRITE POSITIVE (A) 03/30/2017 1643   LEUKOCYTESUR LARGE (A) 03/30/2017 1643   Sepsis Labs Invalid input(s): PROCALCITONIN,  WBC,  LACTICIDVEN Microbiology Recent Results (from the past 240 hour(s))  Blood Culture (routine x 2)     Status: Abnormal   Collection Time: 03/30/17  2:46 PM  Result Value Ref Range Status   Specimen Description BLOOD RIGHT FOREARM   Final   Special Requests   Final    BOTTLES DRAWN AEROBIC AND ANAEROBIC Blood Culture adequate volume   Culture  Setup Time   Final    IN BOTH AEROBIC AND ANAEROBIC BOTTLES GRAM NEGATIVE RODS CRITICAL RESULT CALLED TO, READ BACK BY AND VERIFIED WITH: TO BGREEN(PHAMd) BY TCLEVELAND 03/31/2017 AT 5:02AM Performed at [facility], [place] [address], [place], [place] [address]    Culture ESCHERICHIA COLI (A)  Final   Report Status 04/02/2017 FINAL  Final   Organism ID, Bacteria ESCHERICHIA COLI  Final      Susceptibility   Escherichia coli - MIC*    AMPICILLIN >=32 RESISTANT Resistant     CEFAZOLIN <=4 SENSITIVE Sensitive     CEFEPIME <=1 SENSITIVE Sensitive     CEFTAZIDIME <=1 SENSITIVE Sensitive     CEFTRIAXONE <=1 SENSITIVE Sensitive     CIPROFLOXACIN <=0.25 SENSITIVE Sensitive     GENTAMICIN <=1 SENSITIVE Sensitive     IMIPENEM <=0.25 SENSITIVE Sensitive     TRIMETH/SULFA >=320 RESISTANT Resistant     AMPICILLIN/SULBACTAM 16 INTERMEDIATE Intermediate     PIP/TAZO <=4 SENSITIVE Sensitive     Extended ESBL NEGATIVE Sensitive     * ESCHERICHIA COLI  Blood Culture ID Panel (Reflexed)     Status: Abnormal   Collection Time: 03/30/17  2:46 PM  Result Value Ref Range Status   Enterococcus species NOT DETECTED NOT DETECTED Final   Listeria monocytogenes NOT DETECTED NOT DETECTED Final   Staphylococcus species NOT DETECTED NOT DETECTED Final   Staphylococcus aureus NOT DETECTED NOT DETECTED Final   Streptococcus species NOT DETECTED NOT DETECTED Final   Streptococcus agalactiae NOT DETECTED NOT DETECTED Final   Streptococcus pneumoniae NOT DETECTED NOT DETECTED Final   Streptococcus pyogenes NOT DETECTED NOT DETECTED Final   Acinetobacter baumannii NOT DETECTED NOT DETECTED Final   Enterobacteriaceae species DETECTED (A) NOT DETECTED Final    Comment: Enterobacteriaceae represent a large family of gram-negative bacteria, not a single organism. CRITICAL RESULT CALLED TO, READ BACK BY  AND VERIFIED WITH: TO BGREEN(PHARMd) BY TCLEVELAND 03/31/2017 AT 5:02AM    Enterobacter cloacae complex NOT DETECTED NOT DETECTED Final   Escherichia coli DETECTED (A) NOT DETECTED Final    Comment: CRITICAL RESULT CALLED TO, READ BACK BY AND VERIFIED WITH: TO BGREEN(PHARMd) BY TCLEVELAND 03/31/2017 AT 5:02AM    Klebsiella oxytoca NOT DETECTED NOT DETECTED Final   Klebsiella pneumoniae NOT DETECTED NOT DETECTED Final   Proteus species NOT DETECTED NOT DETECTED Final   Serratia marcescens NOT DETECTED NOT DETECTED Final   Carbapenem resistance NOT DETECTED NOT DETECTED Final   Haemophilus influenzae NOT DETECTED NOT DETECTED Final   Neisseria meningitidis NOT DETECTED NOT DETECTED Final   Pseudomonas aeruginosa NOT DETECTED NOT DETECTED Final   Candida albicans NOT DETECTED NOT DETECTED Final   Candida glabrata NOT DETECTED NOT DETECTED Final   Candida krusei NOT DETECTED NOT DETECTED Final   Candida parapsilosis NOT DETECTED NOT DETECTED Final   Candida tropicalis NOT DETECTED NOT DETECTED Final  Comment: Performed at [facility], [place] [address], [place], [place] [address]  Urine culture     Status: Abnormal   Collection Time: 03/30/17  4:43 PM  Result Value Ref Range Status   Specimen Description URINE, CLEAN CATCH  Final   Special Requests NONE  Final   Culture 60,000 COLONIES/mL ESCHERICHIA COLI (A)  Final   Report Status 04/01/2017 FINAL  Final   Organism ID, Bacteria ESCHERICHIA COLI (A)  Final      Susceptibility   Escherichia coli - MIC*    AMPICILLIN >=32 RESISTANT Resistant     CEFAZOLIN <=4 SENSITIVE Sensitive     CEFTRIAXONE <=1 SENSITIVE Sensitive     CIPROFLOXACIN <=0.25 SENSITIVE Sensitive     GENTAMICIN <=1 SENSITIVE Sensitive     IMIPENEM <=0.25 SENSITIVE Sensitive     NITROFURANTOIN 64 INTERMEDIATE Intermediate     TRIMETH/SULFA >=320 RESISTANT Resistant     AMPICILLIN/SULBACTAM 16 INTERMEDIATE Intermediate     PIP/TAZO <=4 SENSITIVE Sensitive      Extended ESBL NEGATIVE Sensitive     * 60,000 COLONIES/mL ESCHERICHIA COLI  Urine culture     Status: Abnormal   Collection Time: 03/30/17  7:07 PM  Result Value Ref Range Status   Specimen Description CYSTOSCOPY LEFT KIDNEY  Final   Special Requests NONE  Final   Culture 50,000 COLONIES/mL ESCHERICHIA COLI (A)  Final   Report Status 04/02/2017 FINAL  Final   Organism ID, Bacteria ESCHERICHIA COLI (A)  Final      Susceptibility   Escherichia coli - MIC*    AMPICILLIN >=32 RESISTANT Resistant     CEFAZOLIN <=4 SENSITIVE Sensitive     CEFEPIME <=1 SENSITIVE Sensitive     CEFTAZIDIME <=1 SENSITIVE Sensitive     CEFTRIAXONE <=1 SENSITIVE Sensitive     CIPROFLOXACIN <=0.25 SENSITIVE Sensitive     GENTAMICIN <=1 SENSITIVE Sensitive     IMIPENEM <=0.25 SENSITIVE Sensitive     TRIMETH/SULFA >=320 RESISTANT Resistant     AMPICILLIN/SULBACTAM 16 INTERMEDIATE Intermediate     PIP/TAZO <=4 SENSITIVE Sensitive     Extended ESBL NEGATIVE Sensitive     * 50,000 COLONIES/mL ESCHERICHIA COLI  MRSA PCR Screening     Status: None   Collection Time: 03/30/17  8:53 PM  Result Value Ref Range Status   MRSA by PCR NEGATIVE NEGATIVE Final    Comment:        The GeneXpert MRSA Assay (FDA approved for NASAL specimens only), is one component of a comprehensive MRSA colonization surveillance program. It is not intended to diagnose MRSA infection nor to guide or monitor treatment for MRSA infections.   Blood Culture (routine x 2)     Status: None (Preliminary result)   Collection Time: 03/30/17  9:07 PM  Result Value Ref Range Status   Specimen Description BLOOD LEFT HAND  Final   Special Requests IN PEDIATRIC BOTTLE Blood Culture adequate volume  Final   Culture   Final    NO GROWTH 3 DAYS Performed at [facility], [place] [address], [place], [place] [address]    Report Status PENDING  Incomplete     Time coordinating discharge: 20 minutes  SIGNED:  Marzetta Board, MD  [facility] 04/03/2017, 1:50 PM Pager [phone]  If 7PM-7AM, please contact night-coverage [email or web address] Password [email or web address]

## 2017-04-04 ENCOUNTER — Encounter (HOSPITAL_COMMUNITY): Payer: Self-pay | Admitting: Urology

## 2017-04-04 LAB — CULTURE, BLOOD (ROUTINE X 2)
Culture: NO GROWTH
SPECIAL REQUESTS: ADEQUATE

## 2017-04-06 ENCOUNTER — Other Ambulatory Visit: Payer: Self-pay | Admitting: Urology

## 2017-04-07 LAB — CULTURE, BLOOD (ROUTINE X 2)
Culture: NO GROWTH
Culture: NO GROWTH
SPECIAL REQUESTS: ADEQUATE
Special Requests: ADEQUATE

## 2017-04-12 ENCOUNTER — Encounter (HOSPITAL_BASED_OUTPATIENT_CLINIC_OR_DEPARTMENT_OTHER): Payer: Self-pay | Admitting: *Deleted

## 2017-04-12 NOTE — Progress Notes (Signed)
SPOKE W/ PT DAUGHTER, JAKAI Ozturk, WHOM WILL INTERPRET FOR PT DOS.  PT SPEAKS GUJARATI (FROM [place]).  NPO AFTER MN.  ARRIVE AT 0715.  CURRENT LAB RESULTS AND EKG IN CHART AND EPIC.  WILL TAKE NORVASC AM DOS W/ SIPS OF WATER.

## 2017-04-18 NOTE — H&P (Signed)
Office Visit Report     04/06/2017   --------------------------------------------------------------------------------   Wendy Walsh  MRN: [id number]  PRIMARY CARE:  Celedonio Miyamoto, MD  DOB: 1951-07-18, 66 year old Female  REFERRING:    SSN: [id number]  PROVIDER:  Festus Aloe, M.D.    TREATING:  Azucena Fallen    LOCATION:  [facility] [phone]   --------------------------------------------------------------------------------   CC: I have ureteral stone.  HPI: Wendy Walsh is a 66 year-old female patient who is here for ureteral stone.  Patient presents today for follow up from cystoscopy with left ureteral stent placement on 5/30 for left flank pain with sepsis. She orginally presented to the emergency department with left flank and LLQ pain. CT imaging revealed 5.5 X 3 mm left proximal ureteral stone. UA at the time was normal. She presented again a few days later with continued left flank pain and septic shock. She had history of stone over 10 years ago, but passed that stone on her own. Currenlty, she feels well. She continues to use Tamsulosin and Cipro, as prescribed. She denies any flank pain, suprapubic pain, gross hematuria, fevers, chills, nausea, or vomiting.    The problem is on the left side. She first stated noticing pain on 03/30/2017. This is not her first kidney stone. She is not currently having flank pain, back pain, groin pain, nausea, vomiting, fever or chills.   She has had ureteral stent for treatment of her stones in the past.     ALLERGIES: None   MEDICATIONS: Cipro  Amlodipine Besylate 5 mg tablet  Flomax 0.4 mg capsule, ext release 24 hr     GU PSH: None   NON-GU PSH: None   GU PMH: None   NON-GU PMH: Hypercholesterolemia Hypertension    FAMILY HISTORY: None   SOCIAL HISTORY: Marital Status: Widowed Current Smoking Status: Patient has never smoked.   Tobacco Use Assessment Completed: Used Tobacco in last 30 days? Does  not drink caffeine.     Notes: 1 son, 1 daughter    REVIEW OF SYSTEMS:    GU Review Female:   Patient reports frequent urination and get up at night to urinate. Patient denies hard to postpone urination, burning /pain with urination, leakage of urine, stream starts and stops, trouble starting your stream, have to strain to urinate, and being pregnant.  Gastrointestinal (Upper):   Patient denies nausea, vomiting, and indigestion/ heartburn.  Gastrointestinal (Lower):   Patient denies diarrhea and constipation.  Constitutional:   Patient denies fever, night sweats, weight loss, and fatigue.  Skin:   Patient denies skin rash/ lesion and itching.  Eyes:   Patient denies blurred vision and double vision.  Ears/ Nose/ Throat:   Patient denies sore throat and sinus problems.  Hematologic/Lymphatic:   Patient denies swollen glands and easy bruising.  Cardiovascular:   Patient denies leg swelling and chest pains.  Respiratory:   Patient denies cough and shortness of breath.  Endocrine:   Patient denies excessive thirst.  Musculoskeletal:   Patient denies back pain and joint pain.  Neurological:   Patient denies headaches and dizziness.  Psychologic:   Patient denies depression and anxiety.   VITAL SIGNS:      04/06/2017 10:12 AM  Weight 130 lb / 58.97 kg  Height 66 in / 167.64 cm  BP 121/79 mmHg  Pulse 74 /min  Temperature 98.0 F / 37 C  BMI 21.0 kg/m   MULTI-SYSTEM PHYSICAL EXAMINATION:    Constitutional: Well-nourished. No physical  deformities. Normally developed. Good grooming.  Respiratory: No labored breathing, no use of accessory muscles. CTA bilaterally.   Cardiovascular: Normal temperature, no swelling, no varicosities, regular rate and rhythm.   Skin: No paleness, no jaundice, no cyanosis. No lesion, no ulcer, no rash.  Neurologic / Psychiatric: Oriented to time, oriented to place, oriented to person. No depression, no anxiety, no agitation.  Gastrointestinal: No mass, no  tenderness, no rigidity, non obese abdomen.  Musculoskeletal: Spine, ribs, pelvis no bilateral tenderness. Normal gait and station of head and neck.     PAST DATA REVIEWED:  Source Of History:  Patient  Records Review:   Previous Patient Records  Urine Test Review:   Urinalysis, Urine Culture  X-Ray Review: KUB: Reviewed Films.  Renal Ultrasound: Reviewed Films.  C.T. Stone Protocol: Reviewed Films.     PROCEDURES:          Catheter / SP Tube - 51701 In and Out Catheterization  A 14 French red rubber or straight catheter was inserted into the bladder using sterile technique. A urinalysis was sent to the lab. 60 cc of urine was obtained.         Urinalysis w/Scope Dipstick Dipstick Cont'd Micro  Color: Yellow Bilirubin: Neg WBC/hpf: 0 - 5/hpf  Appearance: Clear Ketones: Neg RBC/hpf: 3 - 10/hpf  Specific Gravity: 1.025 Blood: 1+ Bacteria: Few (10-25/hpf)  pH: <=5.0 Protein: Neg Cystals: Amorph Urates  Glucose: Neg Urobilinogen: 0.2 Casts: NS (Not Seen)    Nitrites: Neg Trichomonas: Not Present    Leukocyte Esterase: Neg Mucous: Not Present      Epithelial Cells: NS (Not Seen)      Yeast: NS (Not Seen)      Sperm: Not Present         Urinalysis w/Scope Dipstick Dipstick Cont'd Micro  Color: Yellow Bilirubin: Neg WBC/hpf: 0 - 5/hpf  Appearance: Cloudy Ketones: Neg RBC/hpf: 3 - 10/hpf  Specific Gravity: 1.025 Blood: 1+ Bacteria: Mod (26-50/hpf)  pH: <=5.0 Protein: Neg Cystals: NS (Not Seen)  Glucose: Neg Urobilinogen: 0.2 Casts: NS (Not Seen)    Nitrites: Neg Trichomonas: Not Present    Leukocyte Esterase: Neg Mucous: Present      Epithelial Cells: 10 - 20/hpf      Yeast: NS (Not Seen)      Sperm: Not Present    ASSESSMENT:      ICD-10 Details  1 GU:   Ureteral calculus - N20.1    PLAN:           Orders Labs Urine Culture CATH          Schedule Return Visit/Planned Activity: Next Available Appointment - Schedule Surgery          Document Letter(s):  Created  for Patient: Clinical Summary         Notes:   Initial UA does show moderate bacteriuria, but quite a few epithelial cells. However, cath UA looks improved. I will send cath specimen for urine culture. I discussed with the patient that she would need to be scheduled for left ureteroscopy. We discussed the indications, risks, and benefits of that procedure in detail. Risks included but are not limited to: infection, bleeding, pain, ureteral injury, ureteral avulsion, kidney injury, stent related pain, inability to perform the procedure, need for additional procedure, and anesthetic complications. She voices understanding. She will continue using Flomax and Cipro, as prescribed. Strict return precautions were given for fever or progressive pain, nausea, or vomiting.     ** Signed by Valentino Nose  Fleck on 04/06/17 at 2:55 PM (EDT)**     The information contained in this medical record document is considered private and confidential patient information. This information can only be used for the medical diagnosis and/or medical services that are being provided by the patient's selected caregivers. This information can only be distributed outside of the patient's care if the patient agrees and signs waivers of authorization for this information to be sent to an outside source or route.  Add: F/u urine cx was negative

## 2017-04-19 ENCOUNTER — Encounter (HOSPITAL_BASED_OUTPATIENT_CLINIC_OR_DEPARTMENT_OTHER): Payer: Self-pay | Admitting: *Deleted

## 2017-04-19 ENCOUNTER — Encounter (HOSPITAL_BASED_OUTPATIENT_CLINIC_OR_DEPARTMENT_OTHER)
Admission: RE | Disposition: A | Discharge status: Home / Self Care | Payer: Self-pay | Source: Ambulatory Visit | Attending: Urology

## 2017-04-19 ENCOUNTER — Ambulatory Visit (HOSPITAL_BASED_OUTPATIENT_CLINIC_OR_DEPARTMENT_OTHER): Payer: Self-pay | Admitting: Anesthesiology

## 2017-04-19 ENCOUNTER — Ambulatory Visit (HOSPITAL_BASED_OUTPATIENT_CLINIC_OR_DEPARTMENT_OTHER)
Admission: RE | Admit: 2017-04-19 | Discharge: 2017-04-19 | Disposition: A | Discharge status: Home / Self Care | Payer: Self-pay | Source: Ambulatory Visit | Attending: Urology | Admitting: Urology

## 2017-04-19 DIAGNOSIS — N201 Calculus of ureter: Secondary | ICD-10-CM | POA: Insufficient documentation

## 2017-04-19 DIAGNOSIS — I10 Essential (primary) hypertension: Secondary | ICD-10-CM | POA: Insufficient documentation

## 2017-04-19 DIAGNOSIS — Z79899 Other long term (current) drug therapy: Secondary | ICD-10-CM | POA: Insufficient documentation

## 2017-04-19 HISTORY — DX: Calculus of ureter: N20.1

## 2017-04-19 HISTORY — DX: Personal history of other infectious and parasitic diseases: Z86.19

## 2017-04-19 HISTORY — DX: Personal history of urinary calculi: Z87.442

## 2017-04-19 HISTORY — PX: CYSTOSCOPY/URETEROSCOPY/HOLMIUM LASER/STENT PLACEMENT: SHX6546

## 2017-04-19 HISTORY — DX: Presence of spectacles and contact lenses: Z97.3

## 2017-04-19 SURGERY — CYSTOSCOPY/URETEROSCOPY/HOLMIUM LASER/STENT PLACEMENT
Anesthesia: General | Site: Ureter | Laterality: Left

## 2017-04-19 MED ORDER — SODIUM CHLORIDE 0.9 % IV SOLN
40.0000 ug/min | INTRAVENOUS | Status: DC
  Filled 2017-04-19: qty 1

## 2017-04-19 MED ORDER — CEFAZOLIN SODIUM-DEXTROSE 2-4 GM/100ML-% IV SOLN
INTRAVENOUS | Status: AC
  Filled 2017-04-19: qty 100

## 2017-04-19 MED ORDER — ONDANSETRON HCL 4 MG/2ML IJ SOLN
INTRAMUSCULAR | Status: AC
  Filled 2017-04-19: qty 2

## 2017-04-19 MED ORDER — SUCCINYLCHOLINE CHLORIDE 20 MG/ML IJ SOLN
INTRAMUSCULAR | Status: DC | PRN
  Administered 2017-04-19: 120 mg via INTRAVENOUS

## 2017-04-19 MED ORDER — CEFAZOLIN SODIUM-DEXTROSE 2-4 GM/100ML-% IV SOLN
2.0000 g | INTRAVENOUS | Status: AC
  Administered 2017-04-19: 2 g via INTRAVENOUS
  Filled 2017-04-19: qty 100

## 2017-04-19 MED ORDER — LIDOCAINE 2% (20 MG/ML) 5 ML SYRINGE
INTRAMUSCULAR | Status: AC
  Filled 2017-04-19: qty 5

## 2017-04-19 MED ORDER — PROMETHAZINE HCL 25 MG/ML IJ SOLN
6.2500 mg | INTRAMUSCULAR | Status: DC | PRN
  Filled 2017-04-19: qty 1

## 2017-04-19 MED ORDER — ONDANSETRON HCL 4 MG/2ML IJ SOLN
INTRAMUSCULAR | Status: DC | PRN
  Administered 2017-04-19: 4 mg via INTRAVENOUS

## 2017-04-19 MED ORDER — DEXAMETHASONE SODIUM PHOSPHATE 10 MG/ML IJ SOLN
INTRAMUSCULAR | Status: AC
  Filled 2017-04-19: qty 1

## 2017-04-19 MED ORDER — LACTATED RINGERS IV SOLN
INTRAVENOUS | Status: DC
  Administered 2017-04-19: 08:00:00 via INTRAVENOUS
  Filled 2017-04-19: qty 1000

## 2017-04-19 MED ORDER — KETOROLAC TROMETHAMINE 30 MG/ML IJ SOLN
INTRAMUSCULAR | Status: DC | PRN
  Administered 2017-04-19: 30 mg via INTRAVENOUS

## 2017-04-19 MED ORDER — IOHEXOL 300 MG/ML  SOLN: INTRAMUSCULAR | Status: DC | PRN

## 2017-04-19 MED ORDER — OXYCODONE HCL 5 MG PO TABS
5.0000 mg | ORAL_TABLET | Freq: Once | ORAL | Status: DC | PRN
Start: 2017-04-19 — End: 2017-04-19
  Filled 2017-04-19: qty 1

## 2017-04-19 MED ORDER — CEPHALEXIN 500 MG PO CAPS: 500.0000 mg | ORAL_CAPSULE | Freq: Every day | ORAL | 0 refills | Status: AC

## 2017-04-19 MED ORDER — PROPOFOL 10 MG/ML IV BOLUS
INTRAVENOUS | Status: AC
  Filled 2017-04-19: qty 40

## 2017-04-19 MED ORDER — STERILE WATER FOR IRRIGATION IR SOLN
Status: DC | PRN
  Administered 2017-04-19: 500 mL via INTRAVESICAL

## 2017-04-19 MED ORDER — MIDAZOLAM HCL 2 MG/2ML IJ SOLN
INTRAMUSCULAR | Status: AC
  Filled 2017-04-19: qty 2

## 2017-04-19 MED ORDER — SODIUM CHLORIDE 0.9 % IR SOLN
Status: DC | PRN
  Administered 2017-04-19: 4000 mL

## 2017-04-19 MED ORDER — LIDOCAINE 2% (20 MG/ML) 5 ML SYRINGE
INTRAMUSCULAR | Status: DC | PRN
  Administered 2017-04-19: 60 mg via INTRAVENOUS

## 2017-04-19 MED ORDER — PROPOFOL 10 MG/ML IV BOLUS
INTRAVENOUS | Status: DC | PRN
  Administered 2017-04-19: 130 mg via INTRAVENOUS

## 2017-04-19 MED ORDER — HYDROMORPHONE HCL 1 MG/ML IJ SOLN
0.2500 mg | INTRAMUSCULAR | Status: DC | PRN
  Filled 2017-04-19: qty 0.5

## 2017-04-19 MED ORDER — OXYCODONE HCL 5 MG/5ML PO SOLN
5.0000 mg | Freq: Once | ORAL | Status: DC | PRN
  Filled 2017-04-19: qty 5

## 2017-04-19 MED ORDER — DEXAMETHASONE SODIUM PHOSPHATE 4 MG/ML IJ SOLN
INTRAMUSCULAR | Status: DC | PRN
  Administered 2017-04-19: 10 mg via INTRAVENOUS

## 2017-04-19 MED ORDER — FENTANYL CITRATE (PF) 100 MCG/2ML IJ SOLN
INTRAMUSCULAR | Status: AC
  Filled 2017-04-19: qty 2

## 2017-04-19 MED ORDER — FENTANYL CITRATE (PF) 100 MCG/2ML IJ SOLN
INTRAMUSCULAR | Status: DC | PRN
  Administered 2017-04-19: 50 ug via INTRAVENOUS

## 2017-04-19 SURGICAL SUPPLY — 40 items
BAG DRAIN URO-CYSTO SKYTR STRL (DRAIN) ×3 IMPLANT
BASKET LASER NITINOL 1.9FR (BASKET) ×3 IMPLANT
BASKET STNLS GEMINI 4WIRE 3FR (BASKET) IMPLANT
BASKET ZERO TIP NITINOL 2.4FR (BASKET) IMPLANT
CATH INTERMIT  6FR 70CM (CATHETERS) IMPLANT
CATH URET 5FR 28IN CONE TIP (BALLOONS)
CATH URET 5FR 28IN OPEN ENDED (CATHETERS) ×3 IMPLANT
CATH URET 5FR 70CM CONE TIP (BALLOONS) IMPLANT
CATH URET DUAL LUMEN 6-10FR 50 (CATHETERS) IMPLANT
CLOTH BEACON ORANGE TIMEOUT ST (SAFETY) ×3 IMPLANT
ELECT REM PT RETURN 9FT ADLT (ELECTROSURGICAL)
ELECTRODE REM PT RTRN 9FT ADLT (ELECTROSURGICAL) IMPLANT
GLOVE BIO SURGEON STRL SZ 6.5 (GLOVE) ×2 IMPLANT
GLOVE BIO SURGEON STRL SZ7.5 (GLOVE) ×3 IMPLANT
GLOVE BIO SURGEONS STRL SZ 6.5 (GLOVE) ×1
GLOVE BIOGEL PI IND STRL 7.0 (GLOVE) ×1 IMPLANT
GLOVE BIOGEL PI INDICATOR 7.0 (GLOVE) ×2
GOWN STRL REUS W/ TWL LRG LVL3 (GOWN DISPOSABLE) ×1 IMPLANT
GOWN STRL REUS W/ TWL XL LVL3 (GOWN DISPOSABLE) ×1 IMPLANT
GOWN STRL REUS W/TWL LRG LVL3 (GOWN DISPOSABLE) ×2
GOWN STRL REUS W/TWL XL LVL3 (GOWN DISPOSABLE) ×2
GUIDEWIRE 0.038 PTFE COATED (WIRE) IMPLANT
GUIDEWIRE ANG ZIPWIRE 038X150 (WIRE) IMPLANT
GUIDEWIRE STR DUAL SENSOR (WIRE) ×3 IMPLANT
IV NS 1000ML (IV SOLUTION) ×2
IV NS 1000ML BAXH (IV SOLUTION) ×1 IMPLANT
IV NS IRRIG 3000ML ARTHROMATIC (IV SOLUTION) ×3 IMPLANT
KIT BALLIN UROMAX 15FX10 (LABEL) IMPLANT
KIT BALLN UROMAX 15FX4 (MISCELLANEOUS) IMPLANT
KIT BALLN UROMAX 26 75X4 (MISCELLANEOUS)
KIT RM TURNOVER CYSTO AR (KITS) ×3 IMPLANT
MANIFOLD NEPTUNE II (INSTRUMENTS) ×3 IMPLANT
PACK CYSTO (CUSTOM PROCEDURE TRAY) ×3 IMPLANT
SET HIGH PRES BAL DIL (LABEL)
SHEATH ACCESS URETERAL 38CM (SHEATH) IMPLANT
STENT URET 6FRX24 CONTOUR (STENTS) ×3 IMPLANT
SYRINGE IRR TOOMEY STRL 70CC (SYRINGE) IMPLANT
TUBE CONNECTING 12'X1/4 (SUCTIONS) ×1
TUBE CONNECTING 12X1/4 (SUCTIONS) ×2 IMPLANT
WATER STERILE IRR 500ML POUR (IV SOLUTION) ×3 IMPLANT

## 2017-04-19 NOTE — Op Note (Signed)
   Preoperative diagnosis: Left distal ureteral stone Postoperative diagnosis: Same  Procedure: Cystoscopy left ureteroscopy stone basket extraction, left ureteral stent exchange  Surgeon: Junious Silk  Anesthesia: Gen.  Indication for procedure: 66 year old female who underwent an urgent stent for sepsis and an obstructing proximal left ureteral stone which appeared to be in the left distal ureter on stent placement. She was brought today for definitive ureteroscopy.  Findings: The urethra and bladder were unremarkable. The left stent was in place. The stone was located in the left distal ureter. I was able to advance the scope into the left proximal ureter and no other stones were noted.  Description of procedure: After consent was obtained patient brought to the operating room. After adequate anesthesia she was placed in lithotomy position and prepped and draped in the usual sterile fashion. A timeout was performed to confirm the patient and procedure. The cystoscope was passed per urethra. The bladder was irrigated. The urine appeared clear. The left stent was grasped and brought through the urethral meatus and a sensor wire advanced and coiled in the collecting system. The stent was removed. The semirigid ureteroscope, short dual channel, was advanced along the wire and the stone was located in the distal ureter. It was surrounded by an escape basket and removed intact without difficulty. The ureteroscope was readvanced into the proximal ureter and no other stones were noted. The ureter appeared normal. The wire was backloaded on the cystoscope and a 6 x 24 cm stent was advanced. The wire was removed with a good coil seen in the kidney and a good coil in the bladder. I left a string on the stent. The bladder was drained and she was awakened and taken to recovery room in stable condition.  Complications: none  Blood loss: none  Specimens: stone to patient  Drains: 6x24 cm left ureteral stent

## 2017-04-19 NOTE — Transfer of Care (Signed)
Immediate Anesthesia Transfer of Care Note  Patient: Wendy Walsh  Procedure(s) Performed: Procedure(s): CYSTOSCOPY LEFT URETEROSCOPY/STENT EXCHANGE (Left)  Patient Location: PACU  Anesthesia Type:General  Level of Consciousness: awake, alert , oriented and patient cooperative  Airway & Oxygen Therapy: Patient Spontanous Breathing and Patient connected to nasal cannula oxygen  Post-op Assessment: Report given to RN and Post -op Vital signs reviewed and stable  Post vital signs: Reviewed and stable  Last Vitals:  Vitals:   04/19/17 0714  BP: 140/74  Pulse: 68  Resp: 16  Temp: 36.9 C    Last Pain:  Vitals:   04/19/17 0714  TempSrc: Oral      Patients Stated Pain Goal: 6 (92/90/90 3014)  Complications: No apparent anesthesia complications

## 2017-04-19 NOTE — Anesthesia Postprocedure Evaluation (Signed)
Anesthesia Post Note  Patient: Wendy Walsh  Procedure(s) Performed: Procedure(s) (LRB): CYSTOSCOPY LEFT URETEROSCOPY/STENT EXCHANGE (Left)     Patient location during evaluation: PACU Anesthesia Type: General Level of consciousness: awake and alert Pain management: pain level controlled Vital Signs Assessment: post-procedure vital signs reviewed and stable Respiratory status: spontaneous breathing, nonlabored ventilation and respiratory function stable Cardiovascular status: blood pressure returned to baseline and stable Postop Assessment: no signs of nausea or vomiting Anesthetic complications: no    Last Vitals:  Vitals:   04/19/17 1015 04/19/17 1030  BP: 115/72 130/74  Pulse: (!) 57 (!) 54  Resp: 17 18  Temp:      Last Pain:  Vitals:   04/19/17 0714  TempSrc: Oral                 Lynda Rainwater

## 2017-04-19 NOTE — Discharge Instructions (Signed)
Ureteral Stent Implantation, Care After Refer to this sheet in the next few weeks. These instructions provide you with information about caring for yourself after your procedure. Your health care provider may also give you more specific instructions. Your treatment has been planned according to current medical practices, but problems sometimes occur. Call your health care provider if you have any problems or questions after your procedure.  REMOVAL OF THE STENT: Remove the stent by pulling the string was slow steady pressure on Friday morning June 22 until the entire stent is removed.   What can I expect after the procedure? After the procedure, it is common to have:  Nausea.  Mild pain when you urinate. You may feel this pain in your lower back or lower abdomen. Pain should stop within a few minutes after you urinate. This may last for up to 1 week.  A small amount of blood in your urine for several days.  Follow these instructions at home:  Medicines  Take over-the-counter and prescription medicines only as told by your health care provider.  If you were prescribed an antibiotic medicine, take it as told by your health care provider. Do not stop taking the antibiotic even if you start to feel better.  Do not drive for 24 hours if you received a sedative.  Do not drive or operate heavy machinery while taking prescription pain medicines. Activity  Return to your normal activities as told by your health care provider. Ask your health care provider what activities are safe for you.  Do not lift anything that is heavier than 10 lb (4.5 kg). Follow this limit for 1 week after your procedure, or for as long as told by your health care provider. General instructions  Watch for any blood in your urine. Call your health care provider if the amount of blood in your urine increases.  If you have a catheter: ? Follow instructions from your health care provider about taking care of your  catheter and collection bag. ? Do not take baths, swim, or use a hot tub until your health care provider approves.  Drink enough fluid to keep your urine clear or pale yellow.  Keep all follow-up visits as told by your health care provider. This is important. Contact a health care provider if:  You have pain that gets worse or does not get better with medicine, especially pain when you urinate.  You have difficulty urinating.  You feel nauseous or you vomit repeatedly during a period of more than 2 days after the procedure. Get help right away if:  Your urine is dark red or has blood clots in it.  You are leaking urine (have incontinence).  The end of the stent comes out of your urethra.  You cannot urinate.  You have sudden, sharp, or severe pain in your abdomen or lower back.  You have a fever. This information is not intended to replace advice given to you by your health care provider. Make sure you discuss any questions you have with your health care provider. Document Released: 06/20/2013 Document Revised: 03/25/2016 Document Reviewed: 05/02/2015 Elsevier Interactive Patient Education  2018 [place] Anesthesia Home Care Instructions  Activity: Get plenty of rest for the remainder of the day. A responsible individual must stay with you for 24 hours following the procedure.  For the next 24 hours, DO NOT: -Drive a car -Paediatric nurse -Drink alcoholic beverages -Take any medication unless instructed by your physician -Make any legal decisions  or sign important papers.  Meals: Start with liquid foods such as gelatin or soup. Progress to regular foods as tolerated. Avoid greasy, spicy, heavy foods. If nausea and/or vomiting occur, drink only clear liquids until the nausea and/or vomiting subsides. Call your physician if vomiting continues.  Special Instructions/Symptoms: Your throat may feel dry or sore from the anesthesia or the breathing tube placed in  your throat during surgery. If this causes discomfort, gargle with warm salt water. The discomfort should disappear within 24 hours.  If you had a scopolamine patch placed behind your ear for the management of post- operative nausea and/or vomiting:  1. The medication in the patch is effective for 72 hours, after which it should be removed.  Wrap patch in a tissue and discard in the trash. Wash hands thoroughly with soap and water. 2. You may remove the patch earlier than 72 hours if you experience unpleasant side effects which may include dry mouth, dizziness or visual disturbances. 3. Avoid touching the patch. Wash your hands with soap and water after contact with the patch.

## 2017-04-19 NOTE — Anesthesia Procedure Notes (Signed)
Procedure Name: Intubation Date/Time: 04/19/2017 9:08 AM Performed by: Wanita Chamberlain Pre-anesthesia Checklist: Patient identified, Timeout performed, Emergency Drugs available, Suction available and Patient being monitored Patient Re-evaluated:Patient Re-evaluated prior to inductionOxygen Delivery Method: Circle system utilized Preoxygenation: Pre-oxygenation with 100% oxygen Intubation Type: IV induction Ventilation: Mask ventilation with difficulty Laryngoscope Size: Mac and 3 Grade View: Grade I Tube type: Oral Tube size: 7.0 mm Number of attempts: 1 Airway Equipment and Method: Stylet Placement Confirmation: ETT inserted through vocal cords under direct vision and positive ETCO2 Secured at: 20 cm Dental Injury: Teeth and Oropharynx as per pre-operative assessment  Comments: Small ,crowded mouth. Grade I view with cricoid pressure.

## 2017-04-19 NOTE — Anesthesia Preprocedure Evaluation (Addendum)
Anesthesia Evaluation  Patient identified by MRN, date of birth, ID band Patient awake    Reviewed: Allergy & Precautions, NPO status , Patient's Chart, lab work & pertinent test results  Airway Mallampati: II  TM Distance: >3 FB Neck ROM: Full    Dental no notable dental hx. (+) Teeth Intact, Dental Advisory Given,    Pulmonary neg pulmonary ROS,    Pulmonary exam normal breath sounds clear to auscultation       Cardiovascular Exercise Tolerance: Good hypertension, Pt. on medications negative cardio ROS Normal cardiovascular exam Rhythm:Regular Rate:Normal     Neuro/Psych negative neurological ROS  negative psych ROS   GI/Hepatic negative GI ROS, Neg liver ROS,   Endo/Other  negative endocrine ROS  Renal/GU Renal diseasenegative Renal ROSSeptic  negative genitourinary   Musculoskeletal negative musculoskeletal ROS (+)   Abdominal   Peds negative pediatric ROS (+)  Hematology negative hematology ROS (+)   Anesthesia Other Findings   Reproductive/Obstetrics negative OB ROS                           Anesthesia Physical  Anesthesia Plan  ASA: II  Anesthesia Plan: General   Post-op Pain Management:    Induction: Intravenous  PONV Risk Score and Plan: 4 or greater and Ondansetron, Dexamethasone, Propofol, Midazolam and Treatment may vary due to age or medical condition  Airway Management Planned: Oral ETT  Additional Equipment:   Intra-op Plan:   Post-operative Plan: Extubation in OR  Informed Consent: I have reviewed the patients History and Physical, chart, labs and discussed the procedure including the risks, benefits and alternatives for the proposed anesthesia with the patient or authorized representative who has indicated his/her understanding and acceptance.   Dental advisory given  Plan Discussed with: CRNA  Anesthesia Plan Comments:         Anesthesia Quick  Evaluation

## 2017-04-19 NOTE — Interval H&P Note (Signed)
History and Physical Interval Note:  04/19/2017 8:56 AM  Wendy Walsh  has presented today for surgery, with the diagnosis of LEFT URETERAL STONE  The various methods of treatment have been discussed with the patient and family. After consideration of risks, benefits and other options for treatment, the patient has consented to  Procedure(s): LEFT URETEROSCOPY/HOLMIUM LASER/STENT EXCHANGE (Left) as a surgical intervention .  The patient's history has been reviewed, patient examined, no change in status, stable for surgery. She's been well. No fever or chills. Minimal stent pain.  Discussed with patient and daughter it's possible she passed the stone.  I have reviewed the patient's chart and labs.  Questions were answered to the patient's satisfaction.     , 

## 2017-04-20 ENCOUNTER — Encounter (HOSPITAL_BASED_OUTPATIENT_CLINIC_OR_DEPARTMENT_OTHER): Payer: Self-pay | Admitting: Urology

## 2017-09-06 ENCOUNTER — Encounter: Payer: Self-pay | Admitting: Internal Medicine

## 2017-09-06 ENCOUNTER — Ambulatory Visit: Payer: Self-pay | Admitting: Internal Medicine

## 2017-09-06 VITALS — BP 138/88 | HR 60 | Resp 12 | Ht 62.0 in | Wt 130.0 lb

## 2017-09-06 DIAGNOSIS — E785 Hyperlipidemia, unspecified: Secondary | ICD-10-CM

## 2017-09-06 DIAGNOSIS — Z9109 Other allergy status, other than to drugs and biological substances: Secondary | ICD-10-CM

## 2017-09-06 DIAGNOSIS — R109 Unspecified abdominal pain: Secondary | ICD-10-CM

## 2017-09-06 DIAGNOSIS — I10 Essential (primary) hypertension: Secondary | ICD-10-CM

## 2017-09-06 LAB — POCT URINALYSIS DIPSTICK
BILIRUBIN UA: NEGATIVE
Blood, UA: NEGATIVE
Glucose, UA: NEGATIVE
KETONES UA: NEGATIVE
LEUKOCYTES UA: NEGATIVE
Nitrite, UA: NEGATIVE
Protein, UA: 15
Spec Grav, UA: 1.02 (ref 1.010–1.025)
Urobilinogen, UA: 0.2 E.U./dL
pH, UA: 5 (ref 5.0–8.0)

## 2017-09-06 MED ORDER — CETIRIZINE HCL 10 MG PO TABS: ORAL_TABLET | ORAL | 11 refills | Status: DC

## 2017-09-06 MED ORDER — FAMOTIDINE 20 MG PO TABS: ORAL_TABLET | ORAL | 4 refills | Status: DC

## 2017-09-06 NOTE — Progress Notes (Addendum)
Subjective:    Patient ID: Wendy Walsh, female    DOB: Apr 19, 1951, 66 y.o.   MRN: [id number]  HPI   Here to establish Daughter, Kenn File, interprets  1.  Nauseated for 2 days.  No appetite.  Worsening nausea with oral intake.  She is not having a fever, but is cold.  No diarrhea.  No constipation.  No stool color change.  No melena or hematochezia. No definite abdominal pain. Drinking fluids fine.  Not clear if she feels more nauseated with fluid intake.   Has had a cough as well, but that is a chronic issue--has had for years.  She states has never been evaluated.  No problems with urination:  No dysuria, no frequency or loss of control.   Took Bactrim last night from a prescription she was given for possible UTI by her primary care in [place]. After long discussion, clear she has had these episodically.  She has noted the symptoms on and off since her kidney stone issues in May/June of this year. Symptoms have recurred 3 times.  She includes the passing of her left ureteral stone as one of these episodes.   Second episode, she was treated with Bactrim for 10 days, though UA and urine culture was negative.  She was stopped after 7 days.  She stats she felt better after 15 days. She actually felt better after stopping the antibiotic .  She did not have these problems episodically before having her kidney stone. No one else in household has been lll.  Is having bilateral mid thoracic back pain--this is not in the area of her scapula   2.  Essential Hypertension:  Treated with Amlodipine 5 mg.  Previously, was followed by a Dr. Milly Jakob in [place].   She is transferring care here.  3.  Left ureteral stone, 03/30/2017.  Removed with cystoscopy.  Not clear any stone analysis was performed.  Apparently, developed sepsis with this stone and UTI.  4.  Hyperlipidemia:  Taking Crestor 10 mg daily and told her cholesterol panel is at goal with this dosing.  5.  Chronic cough:  Clear sputum  produced. Sneezes a lot first thing in the morning. No pets.  No moisture issues in home.  Wall to wall carpeting.  No insects.   Vacuums daily.  Changes bed linens weekly. No worsening with season changes.    Current Meds  Medication Sig  . amLODipine (NORVASC) 5 MG tablet Take 5 mg by mouth every morning.   . rosuvastatin (CRESTOR) 10 MG tablet Take 10 mg daily by mouth.    No Known Allergies   Review of Systems     Objective:   Physical Exam  NAD HEENT:  PERRL, EOMI, no conjunctival injection, TMs pearly gray, throat difficult to visualize.  No exudate or obvious erythema.  Unable to see if posterior pharyngeal cobbling.  Nasal mucosa quite swollen on right, moderately so on left with clear discharge. Neck:  Supple, No adenopathy, no thyromegaly Chest:  CTA CV:  RRR with normal S1 and S2, No S3, S4 or murmur.  Radial pulse normal and equal Abd:  + BS, tender in RUQ  +/- Murphy's sign.  No rebound or peritoneal signs.  No HSM or mass, + BS        Assessment & Plan:  1.  Recurrent nausea, not clear if abdominal pain, but early satiety/poor appetite associated. With RUQ tenderness, possible gallbladder concerns. Abdominal ultrasound. Given paperwork to apply for financial assistance at [facility].  CMP, CBC.  UA unremarkable today. Also possibly gastritis/PUD:  Famotidine 40 mg before breakfast daily  2.  Environmental/perennial allergies:  Cetirizine 10 mg daily.  Discuss mattress and pillow covers at follow up.  3.  Essential Hypertension:  Controlled with Amlodipine.  4.  Hyperlipidemia:  Send for records in [place] as recently had FLP by history.

## 2017-09-07 ENCOUNTER — Telehealth: Payer: Self-pay | Admitting: Internal Medicine

## 2017-09-07 ENCOUNTER — Encounter: Payer: Self-pay | Admitting: Internal Medicine

## 2017-09-07 DIAGNOSIS — I10 Essential (primary) hypertension: Secondary | ICD-10-CM

## 2017-09-07 DIAGNOSIS — E785 Hyperlipidemia, unspecified: Secondary | ICD-10-CM | POA: Insufficient documentation

## 2017-09-07 HISTORY — DX: Essential (primary) hypertension: I10

## 2017-09-07 HISTORY — DX: Hyperlipidemia, unspecified: E78.5

## 2017-09-07 LAB — CBC WITH DIFFERENTIAL/PLATELET
Basophils Absolute: 0 10*3/uL (ref 0.0–0.2)
Basos: 0 %
EOS (ABSOLUTE): 0 10*3/uL (ref 0.0–0.4)
Eos: 0 %
HEMOGLOBIN: 14.2 g/dL (ref 11.1–15.9)
Hematocrit: 42.1 % (ref 34.0–46.6)
IMMATURE GRANS (ABS): 0 10*3/uL (ref 0.0–0.1)
Immature Granulocytes: 0 %
LYMPHS ABS: 2.3 10*3/uL (ref 0.7–3.1)
LYMPHS: 32 %
MCH: 28 pg (ref 26.6–33.0)
MCHC: 33.7 g/dL (ref 31.5–35.7)
MCV: 83 fL (ref 79–97)
MONOCYTES: 7 %
Monocytes Absolute: 0.5 10*3/uL (ref 0.1–0.9)
Neutrophils Absolute: 4.3 10*3/uL (ref 1.4–7.0)
Neutrophils: 61 %
PLATELETS: 257 10*3/uL (ref 150–379)
RBC: 5.08 x10E6/uL (ref 3.77–5.28)
RDW: 13.7 % (ref 12.3–15.4)
WBC: 7.1 10*3/uL (ref 3.4–10.8)

## 2017-09-07 LAB — COMPREHENSIVE METABOLIC PANEL
ALBUMIN: 5.1 g/dL — AB (ref 3.6–4.8)
ALK PHOS: 79 IU/L (ref 39–117)
ALT: 21 IU/L (ref 0–32)
AST: 21 IU/L (ref 0–40)
Albumin/Globulin Ratio: 1.6 (ref 1.2–2.2)
BILIRUBIN TOTAL: 1.5 mg/dL — AB (ref 0.0–1.2)
BUN/Creatinine Ratio: 13 (ref 12–28)
BUN: 10 mg/dL (ref 8–27)
CHLORIDE: 104 mmol/L (ref 96–106)
CO2: 25 mmol/L (ref 20–29)
CREATININE: 0.8 mg/dL (ref 0.57–1.00)
Calcium: 10.3 mg/dL (ref 8.7–10.3)
GFR calc Af Amer: 90 mL/min/{1.73_m2} (ref >59)
GFR calc non Af Amer: 78 mL/min/{1.73_m2} (ref >59)
GLUCOSE: 101 mg/dL — AB (ref 65–99)
Globulin, Total: 3.1 g/dL (ref 1.5–4.5)
Potassium: 4.1 mmol/L (ref 3.5–5.2)
Sodium: 144 mmol/L (ref 134–144)
Total Protein: 8.2 g/dL (ref 6.0–8.5)

## 2017-09-07 NOTE — Telephone Encounter (Signed)
Spoke with daughter. Lab results given and Ultrasound testing information given to daughter. Patient is scheduled for 09/12/17 @ 10:30 am. Nothing to eat or drink after midnight the day of testing and patient needs to arrive by 10:15 am. Lab test also added on

## 2017-09-12 ENCOUNTER — Ambulatory Visit (HOSPITAL_COMMUNITY)
Admission: RE | Admit: 2017-09-12 | Discharge: 2017-09-12 | Disposition: A | Discharge status: Home / Self Care | Payer: Self-pay | Source: Ambulatory Visit | Attending: Internal Medicine | Admitting: Internal Medicine

## 2017-09-12 DIAGNOSIS — R109 Unspecified abdominal pain: Secondary | ICD-10-CM | POA: Insufficient documentation

## 2017-09-14 ENCOUNTER — Telehealth: Payer: Self-pay

## 2017-09-14 NOTE — Telephone Encounter (Signed)
Patient daughter calling asking for results of ultrasound and results of additional blood work that was added. Daughter states she needs to know the results.  To Dr. Amil Amen for further direction.

## 2017-09-15 NOTE — Telephone Encounter (Signed)
Spoke with daughter. Results given. Daughter verbalized understanding that results are normal

## 2017-09-15 NOTE — Telephone Encounter (Signed)
Notify the ultrasound was normal.  No kidney stones, no stones in gallbladder and gallbladder looks fine.  If she has another episode, to call for repeat evaluation , but no concerning findings.

## 2017-09-29 LAB — SPECIMEN STATUS REPORT

## 2017-09-29 LAB — BILIRUBIN, DIRECT: Bilirubin, Direct: 0.29 mg/dL (ref 0.00–0.40)

## 2017-10-28 ENCOUNTER — Emergency Department (HOSPITAL_COMMUNITY): Payer: Self-pay

## 2017-10-28 ENCOUNTER — Other Ambulatory Visit: Payer: Self-pay

## 2017-10-28 ENCOUNTER — Encounter (HOSPITAL_COMMUNITY): Payer: Self-pay | Admitting: Emergency Medicine

## 2017-10-28 ENCOUNTER — Emergency Department (HOSPITAL_COMMUNITY)
Admission: EM | Admit: 2017-10-28 | Discharge: 2017-10-28 | Disposition: A | Discharge status: Home / Self Care | Payer: Self-pay | Attending: Emergency Medicine | Admitting: Emergency Medicine

## 2017-10-28 DIAGNOSIS — R1011 Right upper quadrant pain: Secondary | ICD-10-CM | POA: Insufficient documentation

## 2017-10-28 DIAGNOSIS — R109 Unspecified abdominal pain: Secondary | ICD-10-CM

## 2017-10-28 DIAGNOSIS — I10 Essential (primary) hypertension: Secondary | ICD-10-CM | POA: Insufficient documentation

## 2017-10-28 DIAGNOSIS — J189 Pneumonia, unspecified organism: Secondary | ICD-10-CM | POA: Insufficient documentation

## 2017-10-28 DIAGNOSIS — Z79899 Other long term (current) drug therapy: Secondary | ICD-10-CM | POA: Insufficient documentation

## 2017-10-28 DIAGNOSIS — R1012 Left upper quadrant pain: Secondary | ICD-10-CM | POA: Insufficient documentation

## 2017-10-28 LAB — CBC WITH DIFFERENTIAL/PLATELET
Basophils Absolute: 0 10*3/uL (ref 0.0–0.1)
Basophils Relative: 0 %
Eosinophils Absolute: 0.1 10*3/uL (ref 0.0–0.7)
Eosinophils Relative: 1 %
HCT: 37.7 % (ref 36.0–46.0)
Hemoglobin: 12.5 g/dL (ref 12.0–15.0)
Lymphocytes Relative: 20 %
Lymphs Abs: 1.8 10*3/uL (ref 0.7–4.0)
MCH: 27.8 pg (ref 26.0–34.0)
MCHC: 33.2 g/dL (ref 30.0–36.0)
MCV: 83.8 fL (ref 78.0–100.0)
Monocytes Absolute: 0.7 10*3/uL (ref 0.1–1.0)
Monocytes Relative: 8 %
Neutro Abs: 6.4 10*3/uL (ref 1.7–7.7)
Neutrophils Relative %: 71 %
Platelets: 234 10*3/uL (ref 150–400)
RBC: 4.5 MIL/uL (ref 3.87–5.11)
RDW: 13.2 % (ref 11.5–15.5)
WBC: 9 10*3/uL (ref 4.0–10.5)

## 2017-10-28 LAB — COMPREHENSIVE METABOLIC PANEL
ALT: 29 U/L (ref 14–54)
AST: 31 U/L (ref 15–41)
Albumin: 4 g/dL (ref 3.5–5.0)
Alkaline Phosphatase: 65 U/L (ref 38–126)
Anion gap: 9 (ref 5–15)
BUN: 13 mg/dL (ref 6–20)
CO2: 25 mmol/L (ref 22–32)
Calcium: 9.4 mg/dL (ref 8.9–10.3)
Chloride: 107 mmol/L (ref 101–111)
Creatinine, Ser: 0.75 mg/dL (ref 0.44–1.00)
GFR calc Af Amer: >60 mL/min (ref >60)
GFR calc non Af Amer: >60 mL/min (ref >60)
Glucose, Bld: 102 mg/dL — ABNORMAL HIGH (ref 65–99)
Potassium: 3.6 mmol/L (ref 3.5–5.1)
Sodium: 141 mmol/L (ref 135–145)
Total Bilirubin: 1.9 mg/dL — ABNORMAL HIGH (ref 0.3–1.2)
Total Protein: 8.2 g/dL — ABNORMAL HIGH (ref 6.5–8.1)

## 2017-10-28 LAB — LIPASE, BLOOD: Lipase: 34 U/L (ref 11–51)

## 2017-10-28 MED ORDER — DOXYCYCLINE HYCLATE 100 MG PO CAPS: 100.0000 mg | ORAL_CAPSULE | Freq: Two times a day (BID) | ORAL | 0 refills | Status: DC

## 2017-10-28 MED ORDER — ONDANSETRON HCL 4 MG PO TABS: 4.0000 mg | ORAL_TABLET | Freq: Four times a day (QID) | ORAL | 0 refills | Status: DC

## 2017-10-28 MED ORDER — IOPAMIDOL (ISOVUE-300) INJECTION 61%
INTRAVENOUS | Status: AC
  Administered 2017-10-28: 100 mL
  Filled 2017-10-28: qty 100

## 2017-10-28 MED ORDER — OMEPRAZOLE 20 MG PO CPDR: 20.0000 mg | DELAYED_RELEASE_CAPSULE | Freq: Every day | ORAL | 0 refills | Status: DC

## 2017-10-28 NOTE — ED Provider Notes (Signed)
[facility] DEPT Provider Note   CSN: [id number] Arrival date & time: 10/28/17  5027     History   Chief Complaint Chief Complaint  Patient presents with  . Cough  . Abdominal Pain    HPI Wendy Walsh is a 66 y.o. female.  HPI patient's daughter used as Optometrist at patient's request  66 year old female with a past medical history of hypertension, nephrolithiasis presents today with complaints of cough and abdominal pain.  Patient reports her last 2 weeks she has had productive cough with clear mucus.  She notes anterior based chest pain with coughing, and minor shortness of breath at night.  She denies any chest pain at rest, denies any shortness of breath at rest.  She reports low-grade fever last night of 100.7 per her daughter.  No medications prior to arrival.  Patient also notes that she has had 71-month history of progressive abdominal pain.  She notes pain in the right and left upper quadrants worse on the right upper quadrant.  She notes severe nausea with eating, no vomiting.  She notes very little p.o. intake over the last several days due to discomfort.  She denies any lower abdominal pain, dysuria, changes in bowel or bladder characteristics or habits.  She denies any vaginal bleeding or discharge.  She denies any lower extremity swelling or edema.    Past Medical History:  Diagnosis Date  . Essential hypertension 09/07/2017  . History of kidney stones    approx. 2008-- passed  . History of sepsis    03-30-2017  urosepsis due to stone obstruction  . Hyperlipidemia 09/07/2017  . Hypertension   . Left ureteral stone   . Wears glasses     Patient Active Problem List   Diagnosis Date Noted  . Essential hypertension 09/07/2017  . Hyperlipidemia 09/07/2017  . E coli bacteremia 04/03/2017  . AKI (acute kidney injury) ([place])   . Left ureteral stone   . Sepsis ([place])   . Septic shock ([place]) 03/30/2017    Past Surgical History:    Procedure Laterality Date  . CYSTOSCOPY WITH STENT PLACEMENT Left 03/30/2017   Procedure: CYSTOSCOPY WITH STENT PLACEMENT AND RETROGRADE;  Surgeon: Festus Aloe, MD;  Location: [facility];  Service: Urology;  Laterality: Left;  . CYSTOSCOPY/URETEROSCOPY/HOLMIUM LASER/STENT PLACEMENT Left 04/19/2017   Procedure: CYSTOSCOPY LEFT URETEROSCOPY/STENT EXCHANGE;  Surgeon: Festus Aloe, MD;  Location: [facility];  Service: Urology;  Laterality: Left;  . PARTIAL HYSTERECTOMY  1990s   approach unknown (vaginal? or abdominal?)  . TRANSTHORACIC ECHOCARDIOGRAM  04/01/2017   ef 55-60%,  grade 1 diastolic dysfunction/  trivial TR    OB History    No data available       Home Medications    Prior to Admission medications   Medication Sig Start Date End Date Taking? Authorizing Provider  amLODipine (NORVASC) 5 MG tablet Take 5 mg by mouth every morning.    Yes [provider]  Cholecalciferol (VITAMIN D3) 5000 units TABS Take 5,000 Units by mouth daily.   Yes [provider]  rosuvastatin (CRESTOR) 10 MG tablet Take 10 mg daily by mouth.   Yes [provider]  cetirizine (ZYRTEC) 10 MG tablet 1 tab by mouth at bedtime Patient not taking: Reported on 10/28/2017 09/06/17   Mack Hook, MD  doxycycline (VIBRAMYCIN) 100 MG capsule Take 1 capsule (100 mg total) by mouth 2 (two) times daily. 10/28/17   , Dellis Filbert, PA-C  famotidine (PEPCID) 20 MG tablet 2  tabs by mouth 1/2 hour before breakfast daily Patient not taking: Reported on 10/28/2017 09/06/17   Mack Hook, MD  omeprazole (PRILOSEC) 20 MG capsule Take 1 capsule (20 mg total) by mouth daily. 10/28/17   , Dellis Filbert, PA-C  ondansetron (ZOFRAN) 4 MG tablet Take 1 tablet (4 mg total) by mouth every 6 (six) hours. 10/28/17   Okey Regal, PA-C    Family History History reviewed. No pertinent family history.  Social History Social History   Tobacco Use  . Smoking status:  Never Smoker  . Smokeless tobacco: Never Used  Substance Use Topics  . Alcohol use: No  . Drug use: No     Allergies   Patient has no known allergies.   Review of Systems Review of Systems  All other systems reviewed and are negative.    Physical Exam Updated Vital Signs BP 120/64 (BP Location: Right Arm)   Pulse 74   Temp 98.8 F (37.1 C) (Oral)   Resp 16   SpO2 98%   Physical Exam  Constitutional: She is oriented to person, place, and time. She appears well-developed and well-nourished.  HENT:  Head: Normocephalic and atraumatic.  Eyes: Conjunctivae are normal. Pupils are equal, round, and reactive to light. Right eye exhibits no discharge. Left eye exhibits no discharge. No scleral icterus.  Neck: Normal range of motion. No JVD present. No tracheal deviation present.  Cardiovascular: Normal rate, regular rhythm, normal heart sounds and intact distal pulses. Exam reveals no friction rub.  No murmur heard. Pulmonary/Chest: Effort normal and breath sounds normal. No stridor. No respiratory distress. She has no wheezes. She has no rales. She exhibits no tenderness.  Abdominal: Soft. She exhibits no distension and no mass. There is tenderness. There is no rebound and no guarding. No hernia.  Tenderness palpation of right upper and left upper quadrant more tenderness on the right side, minimal tenderness palpation right lower quadrant-abdomen soft with no masses  Musculoskeletal:  No edema  Neurological: She is alert and oriented to person, place, and time. Coordination normal.  Skin: Skin is warm.  Psychiatric: She has a normal mood and affect. Her behavior is normal. Judgment and thought content normal.  Nursing note and vitals reviewed.    ED Treatments / Results  Labs (all labs ordered are listed, but only abnormal results are displayed) Labs Reviewed  COMPREHENSIVE METABOLIC PANEL - Abnormal; Notable for the following components:      Result Value   Glucose, Bld  102 (*)    Total Protein 8.2 (*)    Total Bilirubin 1.9 (*)    All other components within normal limits  CBC WITH DIFFERENTIAL/PLATELET  LIPASE, BLOOD    EKG  EKG Interpretation None       Radiology Dg Chest 2 View  Result Date: 10/28/2017 CLINICAL DATA:  Cough EXAM: CHEST  2 VIEW COMPARISON:  03/30/2017 FINDINGS: The heart size and mediastinal contours are within normal limits. Both lungs are clear. The visualized skeletal structures are unremarkable. IMPRESSION: No active cardiopulmonary disease. Electronically Signed   By: Franchot Gallo M.D.   On: 10/28/2017 13:34   Ct Abdomen Pelvis W Contrast  Result Date: 10/28/2017 CLINICAL DATA:  Abdominal pain and loss of appetite for several days. EXAM: CT ABDOMEN AND PELVIS WITH CONTRAST TECHNIQUE: Multidetector CT imaging of the abdomen and pelvis was performed using the standard protocol following bolus administration of intravenous contrast. CONTRAST:  176mL ISOVUE-300 IOPAMIDOL (ISOVUE-300) INJECTION 61% COMPARISON:  CT scan 03/28/2017 FINDINGS: Lower chest:  The lung bases demonstrate peribronchial thickening, interstitial thickening and subpleural atelectasis. Findings suggest bronchitis or possible early bronchopneumonia. Aspiration would be another consideration. Hepatobiliary: No focal hepatic lesions or intrahepatic biliary dilatation. The gallbladder is normal. No common bile duct dilatation. Pancreas: No mass, inflammation or ductal dilatation. Spleen: Normal size.  No focal lesions. Adrenals/Urinary Tract: The adrenal glands and kidneys are unremarkable. No renal, ureteral or bladder calculi or mass. Stomach/Bowel: The stomach, duodenum, small bowel and colon are grossly normal. No acute inflammatory process, mass lesion or obstructive findings. The terminal ileum is normal. The appendix is normal. Vascular/Lymphatic: The aorta is normal in caliber. No dissection. The branch vessels are patent. The major venous structures are patent.  No mesenteric or retroperitoneal mass or adenopathy. Small scattered lymph nodes are noted. Reproductive: Surgically absent. Other: No pelvic mass or adenopathy. No free pelvic fluid collections. No inguinal mass or adenopathy. No abdominal wall hernia or subcutaneous lesions. Musculoskeletal: No significant bony findings. IMPRESSION: 1. Bilateral bronchitis versus early bronchopneumonia versus aspiration. 2. No acute abdominal/pelvic findings, mass lesions or adenopathy. Electronically Signed   By: Marijo Sanes M.D.   On: 10/28/2017 16:25    Procedures Procedures (including critical care time)  Medications Ordered in ED Medications  iopamidol (ISOVUE-300) 61 % injection (100 mLs  Contrast Given 10/28/17 1558)     Initial Impression / Assessment and Plan / ED Course  I have reviewed the triage vital signs and the nursing notes.  Pertinent labs & imaging results that were available during my care of the patient were reviewed by me and considered in my medical decision making (see chart for details).      Final Clinical Impressions(s) / ED Diagnoses   Final diagnoses:  Community acquired pneumonia, unspecified laterality  Abdominal pain, unspecified abdominal location    Labs: CBC, CMP, lipase, urinalysis  Imaging: DG chest 2 view, CT abdomen pelvis with contrast  Consults:  Therapeutics:  Discharge Meds: Axis I Jamie Brookes, Prilosec  Assessment/Plan: 66 year old female presents today with cough and abdominal pain.  Her CT of her abdomen showed no acute finding.  She does have what appears to be bronchitis versus bronchopneumonia.  Given her cough and duration of symptoms she will be started on antibiotics.  She is well-appearing in no acute distress.  She will be referred to gastroenterology, started on Prilosec and given antinausea medication.  Patient given strict return precautions, she verbalized understanding and agreement to today's plan had no further questions or  concerns at the time of discharge.      ED Discharge Orders        Ordered    doxycycline (VIBRAMYCIN) 100 MG capsule  2 times daily     10/28/17 1708    omeprazole (PRILOSEC) 20 MG capsule  Daily     10/28/17 1709    ondansetron (ZOFRAN) 4 MG tablet  Every 6 hours     10/28/17 1709       Okey Regal, PA-C 10/28/17 2314    Lajean Saver, MD 10/29/17 [phone]

## 2017-10-28 NOTE — ED Triage Notes (Signed)
Pt c/o productive cough with green sputum. Pt also c/o abdominal pain and loss of appetite x several days. Pt c/o mild fever at home.

## 2017-11-07 ENCOUNTER — Ambulatory Visit: Payer: Self-pay | Admitting: Internal Medicine

## 2018-02-09 ENCOUNTER — Encounter: Payer: Self-pay | Admitting: Gastroenterology

## 2018-02-21 ENCOUNTER — Other Ambulatory Visit: Payer: Self-pay | Admitting: Internal Medicine

## 2018-02-21 DIAGNOSIS — R109 Unspecified abdominal pain: Secondary | ICD-10-CM

## 2018-02-21 DIAGNOSIS — G459 Transient cerebral ischemic attack, unspecified: Secondary | ICD-10-CM

## 2018-02-21 DIAGNOSIS — M545 Low back pain: Secondary | ICD-10-CM

## 2018-02-21 DIAGNOSIS — R519 Headache, unspecified: Secondary | ICD-10-CM

## 2018-02-21 DIAGNOSIS — R51 Headache: Secondary | ICD-10-CM

## 2018-02-23 ENCOUNTER — Other Ambulatory Visit: Payer: Self-pay | Admitting: Internal Medicine

## 2018-02-23 ENCOUNTER — Ambulatory Visit
Admission: RE | Admit: 2018-02-23 | Discharge: 2018-02-23 | Disposition: A | Discharge status: Home / Self Care | Payer: Medicare Other | Source: Ambulatory Visit | Attending: Internal Medicine | Admitting: Internal Medicine

## 2018-02-23 ENCOUNTER — Other Ambulatory Visit: Payer: Self-pay

## 2018-02-23 DIAGNOSIS — M545 Low back pain: Secondary | ICD-10-CM

## 2018-02-23 DIAGNOSIS — R51 Headache: Secondary | ICD-10-CM

## 2018-02-23 DIAGNOSIS — R109 Unspecified abdominal pain: Secondary | ICD-10-CM

## 2018-02-23 DIAGNOSIS — G459 Transient cerebral ischemic attack, unspecified: Secondary | ICD-10-CM

## 2018-02-23 DIAGNOSIS — R519 Headache, unspecified: Secondary | ICD-10-CM

## 2018-03-17 ENCOUNTER — Ambulatory Visit (INDEPENDENT_AMBULATORY_CARE_PROVIDER_SITE_OTHER): Payer: Medicare Other | Admitting: Gastroenterology

## 2018-03-17 ENCOUNTER — Encounter: Payer: Self-pay | Admitting: Gastroenterology

## 2018-03-17 VITALS — BP 130/80 | HR 60 | Ht 64.0 in | Wt 140.0 lb

## 2018-03-17 DIAGNOSIS — Z1211 Encounter for screening for malignant neoplasm of colon: Secondary | ICD-10-CM | POA: Diagnosis not present

## 2018-03-17 DIAGNOSIS — K219 Gastro-esophageal reflux disease without esophagitis: Secondary | ICD-10-CM | POA: Diagnosis not present

## 2018-03-17 DIAGNOSIS — R1013 Epigastric pain: Secondary | ICD-10-CM | POA: Diagnosis not present

## 2018-03-17 MED ORDER — OMEPRAZOLE 20 MG PO CPDR: 20.0000 mg | DELAYED_RELEASE_CAPSULE | Freq: Every day | ORAL | 0 refills | Status: AC

## 2018-03-17 MED ORDER — SOD PICOSULFATE-MAG OX-CIT ACD 10-3.5-12 MG-GM -GM/160ML PO SOLN: 1.0000 | Freq: Once | ORAL | 0 refills | Status: AC

## 2018-03-17 NOTE — Progress Notes (Signed)
Chief Complaint: Heartburn  Referring Provider:  Raelyn Number, MD      ASSESSMENT AND PLAN;   #1. Epigastric discomfort with heartburn. Neg CT abdomen and pelvis 10/2017, neg [place] 09/2017 #2. Colorectal cancer screening.  Plan: Restart omerazole 20mg  po every morning. Proceed with EGD and colonoscopy.  I have discussed the risks and benefits.  The risks including risk of perforation requiring laparotomy, bleeding after biopsies/polypectomy requiring blood transfusions and risks of anesthesia/sedation were discussed.  Rare risks of missing UGI and colorectal neoplasms were also discussed.  Alternatives were also given.  Patient is fully aware and agrees to proceed. All the questions were answered. Procedures will be scheduled in upcoming days.  Patient is to report immediately if there is any significant weight loss or excessive bleeding until then. Consent forms were given for review. If still with problems, will give her a trial of lactose-free diet.      HPI:    Wendy Walsh is a 67 y.o. female with vague epigastric pain associated with postprandial nausea and abdominal bloating over the last several years, getting worse over the last 6 months.  She denies having any vomiting.  She does have history of heartburn.  No nonsteroidals.  She was given a trial of omeprazole with good relief but she stopped taking medicines on her own.  She does have previous history of constipation.  Denies having any diarrhea or constipation currently.  She did have problems with hemorrhoids ever since her pregnancy.  Denies having any rectal bleeding.  She had negative ultrasound and CT scan of the abdomen and pelvis within the last 1 year.  Accompanied by her daughter.  Past Medical History:  Diagnosis Date  . Essential hypertension 09/07/2017  . History of kidney stones    approx. 2008-- passed  . History of sepsis    03-30-2017  urosepsis due to stone obstruction  . Hyperlipidemia  09/07/2017  . Hypertension   . Left ureteral stone   . Wears glasses     Past Surgical History:  Procedure Laterality Date  . CYSTOSCOPY WITH STENT PLACEMENT Left 03/30/2017   Procedure: CYSTOSCOPY WITH STENT PLACEMENT AND RETROGRADE;  Surgeon: Festus Aloe, MD;  Location: [facility];  Service: Urology;  Laterality: Left;  . CYSTOSCOPY/URETEROSCOPY/HOLMIUM LASER/STENT PLACEMENT Left 04/19/2017   Procedure: CYSTOSCOPY LEFT URETEROSCOPY/STENT EXCHANGE;  Surgeon: Festus Aloe, MD;  Location: [facility];  Service: Urology;  Laterality: Left;  . PARTIAL HYSTERECTOMY  1990s   approach unknown (vaginal? or abdominal?)  . TRANSTHORACIC ECHOCARDIOGRAM  04/01/2017   ef 55-60%,  grade 1 diastolic dysfunction/  trivial TR    History reviewed. No pertinent family history.  Social History   Tobacco Use  . Smoking status: Never Smoker  . Smokeless tobacco: Never Used  Substance Use Topics  . Alcohol use: No  . Drug use: No    Current Outpatient Medications  Medication Sig Dispense Refill  . amLODipine (NORVASC) 5 MG tablet Take 5 mg by mouth every morning.     . Cholecalciferol (VITAMIN D3) 5000 units TABS Take 5,000 Units by mouth daily.    Marland Kitchen omeprazole (PRILOSEC) 20 MG capsule Take 1 capsule (20 mg total) by mouth daily. 30 capsule 0  . rosuvastatin (CRESTOR) 10 MG tablet Take 10 mg daily by mouth.     No current facility-administered medications for this visit.     No Known Allergies  Review of Systems:  Constitutional: Denies fever, chills, diaphoresis, appetite change and fatigue.  HEENT: Denies photophobia, eye pain, redness, hearing loss, ear pain, congestion, sore throat, rhinorrhea, sneezing, mouth sores, neck pain, neck stiffness and tinnitus.   Respiratory: Denies SOB, DOE, cough, chest tightness,  and wheezing.   Cardiovascular: Denies chest pain, palpitations and leg swelling.  Genitourinary: Denies dysuria, urgency, frequency, hematuria, flank pain and  difficulty urinating.  Musculoskeletal: Denies myalgias, back pain, joint swelling, arthralgias and gait problem.  Skin: No rash.  Neurological: Denies dizziness, seizures, syncope, weakness, light-headedness, numbness and headaches.  Hematological: Denies adenopathy. Easy bruising, personal or family bleeding history  Psychiatric/Behavioral: No anxiety or depression     Physical Exam:    BP 130/80   Pulse 60   Ht 5\' 4"  (1.626 m)   Wt 140 lb (63.5 kg)   BMI 24.03 kg/m  Filed Weights   03/17/18 1034  Weight: 140 lb (63.5 kg)   Constitutional:  Well-developed, in no acute distress. Psychiatric: Normal mood and affect. Behavior is normal. HEENT: Pupils normal.  Conjunctivae are normal. No scleral icterus. Neck supple.  Cardiovascular: Normal rate, regular rhythm. No edema Pulmonary/chest: Effort normal and breath sounds normal. No wheezing, rales or rhonchi. Abdominal: Soft, nondistended. Nontender. Bowel sounds active throughout. There are no masses palpable. No hepatomegaly. Rectal:  defered Neurological: Alert and oriented to person place and time. Skin: Skin is warm and dry. No rashes noted.  Data Reviewed: I have personally reviewed following labs and imaging studies  CBC: CBC Latest Ref Rng & Units 10/28/2017 09/06/2017 04/03/2017  WBC 4.0 - 10.5 K/uL 9.0 7.1 11.1(H)  Hemoglobin 12.0 - 15.0 g/dL 12.5 14.2 10.7(L)  Hematocrit 36.0 - 46.0 % 37.7 42.1 31.4(L)  Platelets 150 - 400 K/uL 234 257 106(L)    CMP: CMP Latest Ref Rng & Units 10/28/2017 09/06/2017 04/03/2017  Glucose 65 - 99 mg/dL 102(H) 101(H) 94  BUN 6 - 20 mg/dL 13 10 13   Creatinine 0.44 - 1.00 mg/dL 0.75 0.80 0.78  Sodium 135 - 145 mmol/L 141 144 141  Potassium 3.5 - 5.1 mmol/L 3.6 4.1 3.4(L)  Chloride 101 - 111 mmol/L 107 104 110  CO2 22 - 32 mmol/L 25 25 25   Calcium 8.9 - 10.3 mg/dL 9.4 10.3 8.1(L)  Total Protein 6.5 - 8.1 g/dL 8.2(H) 8.2 -  Total Bilirubin 0.3 - 1.2 mg/dL 1.9(H) 1.5(H) -  Alkaline Phos  38 - 126 U/L 65 79 -  AST 15 - 41 U/L 31 21 -  ALT 14 - 54 U/L 29 21 -       Radiology Studies: EXAM: CT ABDOMEN AND PELVIS WITH CONTRAST  TECHNIQUE: Multidetector CT imaging of the abdomen and pelvis was performed using the standard protocol following bolus administration of intravenous contrast.  CONTRAST:  127mL ISOVUE-300 IOPAMIDOL (ISOVUE-300) INJECTION 61%  COMPARISON:  CT scan 03/28/2017  FINDINGS: Lower chest: The lung bases demonstrate peribronchial thickening, interstitial thickening and subpleural atelectasis. Findings suggest bronchitis or possible early bronchopneumonia. Aspiration would be another consideration.  Hepatobiliary: No focal hepatic lesions or intrahepatic biliary dilatation. The gallbladder is normal. No common bile duct dilatation.  Pancreas: No mass, inflammation or ductal dilatation.  Spleen: Normal size.  No focal lesions.  Adrenals/Urinary Tract: The adrenal glands and kidneys are unremarkable. No renal, ureteral or bladder calculi or mass.  Stomach/Bowel: The stomach, duodenum, small bowel and colon are grossly normal. No acute inflammatory process, mass lesion or obstructive findings. The terminal ileum is normal. The appendix is normal.  Vascular/Lymphatic: The aorta is normal in caliber. No dissection. The  branch vessels are patent. The major venous structures are patent. No mesenteric or retroperitoneal mass or adenopathy. Small scattered lymph nodes are noted.  Reproductive: Surgically absent.  Other: No pelvic mass or adenopathy. No free pelvic fluid collections. No inguinal mass or adenopathy. No abdominal wall hernia or subcutaneous lesions.  Musculoskeletal: No significant bony findings.  IMPRESSION: 1. Bilateral bronchitis versus early bronchopneumonia versus aspiration. 2. No acute abdominal/pelvic findings, mass lesions or adenopathy.   Electronically Signed   By: Marijo Sanes M.D.   On:  10/28/2017 16:25  CT scan was reviewed independently and also reviewed with the patient.   Carmell Austria, MD 03/17/2018, 10:53 AM  Cc: Raelyn Number, MD

## 2018-03-17 NOTE — Patient Instructions (Signed)
If you are age 65 or older, your body mass index should be between 23-30. Your Body mass index is 24.03 kg/m. If this is out of the aforementioned range listed, please consider follow up with your Primary Care Provider.  If you are age 50 or younger, your body mass index should be between 19-25. Your Body mass index is 24.03 kg/m. If this is out of the aformentioned range listed, please consider follow up with your Primary Care Provider.   We have sent the following medications to your pharmacy for you to pick up at your convenience: Omeprazole 20 mg daily.   You have been scheduled for a colonoscopy. Please follow written instructions given to you at your visit today.  Please pick up your prep supplies at the pharmacy within the next 1-3 days. If you use inhalers (even only as needed), please bring them with you on the day of your procedure. Your physician has requested that you go to www.[email or web address].com and enter the access code given to you at your visit today. This web site gives a general overview about your procedure. However, you should still follow specific instructions given to you by our office regarding your preparation for the procedure.  You have been scheduled for an endoscopy. Please follow written instructions given to you at your visit today. If you use inhalers (even only as needed), please bring them with you on the day of your procedure. Your physician has requested that you go to www.[email or web address].com and enter the access code given to you at your visit today. This web site gives a general overview about your procedure. However, you should still follow specific instructions given to you by our office regarding your preparation for the procedure.  Thank you,  Dr. Jackquline Denmark

## 2018-04-03 ENCOUNTER — Encounter: Payer: Self-pay | Admitting: Gastroenterology

## 2018-04-03 ENCOUNTER — Ambulatory Visit (AMBULATORY_SURGERY_CENTER): Payer: Medicare Other | Admitting: Gastroenterology

## 2018-04-03 VITALS — BP 125/74 | HR 65 | Temp 99.1°F | Resp 13 | Ht 64.0 in | Wt 140.0 lb

## 2018-04-03 DIAGNOSIS — K219 Gastro-esophageal reflux disease without esophagitis: Secondary | ICD-10-CM | POA: Diagnosis not present

## 2018-04-03 DIAGNOSIS — K2970 Gastritis, unspecified, without bleeding: Secondary | ICD-10-CM | POA: Diagnosis not present

## 2018-04-03 DIAGNOSIS — K621 Rectal polyp: Secondary | ICD-10-CM | POA: Diagnosis not present

## 2018-04-03 DIAGNOSIS — D128 Benign neoplasm of rectum: Secondary | ICD-10-CM

## 2018-04-03 DIAGNOSIS — Z1211 Encounter for screening for malignant neoplasm of colon: Secondary | ICD-10-CM | POA: Diagnosis not present

## 2018-04-03 MED ORDER — SODIUM CHLORIDE 0.9 % IV SOLN: 500.0000 mL | Freq: Once | INTRAVENOUS | Status: DC

## 2018-04-03 NOTE — Op Note (Signed)
[place] Patient Name: Wendy Walsh Procedure Date: 04/03/2018 2:30 PM MRN: [id number] Endoscopist: Jackquline Denmark , MD Age: 67 Referring MD:  Date of Birth: 04-Oct-1951 Gender: Female Account #: [id number] Procedure:                Colonoscopy Indications:              Screening for colorectal malignant neoplasm Medicines:                Monitored Anesthesia Care Procedure:                Pre-Anesthesia Assessment:                           - Prior to the procedure, a History and Physical                            was performed, and patient medications and                            allergies were reviewed. The patient is competent.                            The risks and benefits of the procedure and the                            sedation options and risks were discussed with the                            patient. All questions were answered and informed                            consent was obtained. Patient identification and                            proposed procedure were verified by the physician                            in the procedure room. Mental Status Examination:                            alert and oriented. Prophylactic Antibiotics: The                            patient does not require prophylactic antibiotics.                            Prior Anticoagulants: The patient has taken no                            previous anticoagulant or antiplatelet agents. ASA                            Grade Assessment: II - A patient with mild systemic  disease. After reviewing the risks and benefits,                            the patient was deemed in satisfactory condition to                            undergo the procedure. The anesthesia plan was to                            use monitored anesthesia care (MAC). Immediately                            prior to administration of medications, the patient   was re-assessed for adequacy to receive sedatives.                            The heart rate, respiratory rate, oxygen                            saturations, blood pressure, adequacy of pulmonary                            ventilation, and response to care were monitored                            throughout the procedure. The physical status of                            the patient was re-assessed after the procedure.                           After obtaining informed consent, the colonoscope                            was passed under direct vision. Throughout the                            procedure, the patient's blood pressure, pulse, and                            oxygen saturations were monitored continuously. The                            Colonoscope was introduced through the anus and                            advanced to the the cecum, identified by                            appendiceal orifice and ileocecal valve. The                            colonoscopy was performed without difficulty. The  patient tolerated the procedure well. The quality                            of the bowel preparation was excellent. Scope In: 2:43:35 PM Scope Out: 2:58:05 PM Scope Withdrawal Time: 0 hours 11 minutes 31 seconds  Total Procedure Duration: 0 hours 14 minutes 30 seconds  Findings:                 A 6 mm polyp was found in the rectum. The polyp was                            sessile. The polyp was removed with a cold snare.                            Resection and retrieval were complete.                           Non-bleeding internal hemorrhoids were found during                            retroflexion. The hemorrhoids were small and Grade                            II (internal hemorrhoids that prolapse but reduce                            spontaneously).                           Hemorrhoids were found on perianal exam. Complications:            No  immediate complications. Estimated Blood Loss:     Estimated blood loss: none. Impression:               - Small colonic polyp status post polypectomy.                           - Non-bleeding internal hemorrhoids. Recommendation:           - Patient has a contact number available for                            emergencies. The signs and symptoms of potential                            delayed complications were discussed with the                            patient. Return to normal activities tomorrow.                            Written discharge instructions were provided to the                            patient.                           -  Resume previous diet.                           - Continue present medications.                           - Await pathology results.                           - use Preparation H on as needed basis                           - Return to my office in 12 weeks. Jackquline Denmark, MD 04/03/2018 3:13:42 PM This report has been signed electronically.

## 2018-04-03 NOTE — Patient Instructions (Signed)
**  Handouts given on polyps and Gastritis**    YOU HAD AN ENDOSCOPIC PROCEDURE TODAY: Refer to the procedure report and other information in the discharge instructions given to you for any specific questions about what was found during the examination. If this information does not answer your questions, please call [place] office at [phone] to clarify.   YOU SHOULD EXPECT: Some feelings of bloating in the abdomen. Passage of more gas than usual. Walking can help get rid of the air that was put into your GI tract during the procedure and reduce the bloating. If you had a lower endoscopy (such as a colonoscopy or flexible sigmoidoscopy) you may notice spotting of blood in your stool or on the toilet paper. Some abdominal soreness may be present for a day or two, also.  DIET: Your first meal following the procedure should be a light meal and then it is ok to progress to your normal diet. A half-sandwich or bowl of soup is an example of a good first meal. Heavy or fried foods are harder to digest and may make you feel nauseous or bloated. Drink plenty of fluids but you should avoid alcoholic beverages for 24 hours. If you had a esophageal dilation, please see attached instructions for diet.    ACTIVITY: Your care partner should take you home directly after the procedure. You should plan to take it easy, moving slowly for the rest of the day. You can resume normal activity the day after the procedure however YOU SHOULD NOT DRIVE, use power tools, machinery or perform tasks that involve climbing or major physical exertion for 24 hours (because of the sedation medicines used during the test).   SYMPTOMS TO REPORT IMMEDIATELY: A gastroenterologist can be reached at any hour. Please call [phone]  for any of the following symptoms:  Following lower endoscopy (colonoscopy, flexible sigmoidoscopy) Excessive amounts of blood in the stool  Significant tenderness, worsening of abdominal pains  Swelling of  the abdomen that is new, acute  Fever of 100 or higher  Following upper endoscopy (EGD, EUS, ERCP, esophageal dilation) Vomiting of blood or coffee ground material  New, significant abdominal pain  New, significant chest pain or pain under the shoulder blades  Painful or persistently difficult swallowing  New shortness of breath  Black, tarry-looking or red, bloody stools  FOLLOW UP:  If any biopsies were taken you will be contacted by phone or by letter within the next 1-3 weeks. Call [phone]  if you have not heard about the biopsies in 3 weeks.  Please also call with any specific questions about appointments or follow up tests.

## 2018-04-03 NOTE — Progress Notes (Signed)
To recovery, report to RN, VSS. 

## 2018-04-03 NOTE — Op Note (Signed)
[place] [facility] Patient Name: Wendy Walsh Procedure Date: 04/03/2018 2:30 PM MRN: [id number] Endoscopist: Jackquline Denmark , MD Age: 67 Referring MD:  Date of Birth: [birth year] Gender: Female Account #: [id number] Procedure:                Upper GI endoscopy Indications:              Epigastric abdominal pain Medicines:                Monitored Anesthesia Care Procedure:                Pre-Anesthesia Assessment:                           - Prior to the procedure, a History and Physical                            was performed, and patient medications and                            allergies were reviewed. The patient is competent.                            The risks and benefits of the procedure and the                            sedation options and risks were discussed with the                            patient. All questions were answered and informed                            consent was obtained. Patient identification and                            proposed procedure were verified by the physician                            in the procedure room. Mental Status Examination:                            alert and oriented. Prophylactic Antibiotics: The                            patient does not require prophylactic antibiotics.                            Prior Anticoagulants: The patient has taken no                            previous anticoagulant or antiplatelet agents. ASA                            Grade Assessment: II - A patient with mild systemic  disease. After reviewing the risks and benefits,                            the patient was deemed in satisfactory condition to                            undergo the procedure. The anesthesia plan was to                            use monitored anesthesia care (MAC). Immediately                            prior to administration of medications, the patient                            was  re-assessed for adequacy to receive sedatives.                            The heart rate, respiratory rate, oxygen                            saturations, blood pressure, adequacy of pulmonary                            ventilation, and response to care were monitored                            throughout the procedure. The physical status of                            the patient was re-assessed after the procedure.                           After obtaining informed consent, the endoscope was                            passed under direct vision. Throughout the                            procedure, the patient's blood pressure, pulse, and                            oxygen saturations were monitored continuously. The                            Endoscope was introduced through the mouth, and                            advanced to the second part of duodenum. The upper                            GI endoscopy was accomplished without difficulty.  The patient tolerated the procedure well. Scope In: Scope Out: Findings:                 Localized mild inflammation characterized by                            erythema was found in the stomach. Biopsies were                            taken with a cold forceps for histology.                           The exam was otherwise without abnormality. Complications:            No immediate complications. Estimated Blood Loss:     Estimated blood loss: none. Impression:               - Mild gastritis. Biopsied.                           - The examination was otherwise normal. Recommendation:           - Resume previous diet.                           - Continue present medications - omeprazole 64m po                            qd.                           - Await pathology results. RJackquline Denmark MD 04/03/2018 3:07:08 PM This report has been signed electronically.

## 2018-04-03 NOTE — Progress Notes (Signed)
Called to room to assist during endoscopic procedure.  Patient ID and intended procedure confirmed with present staff. Received instructions for my participation in the procedure from the performing physician.  

## 2018-04-03 NOTE — Progress Notes (Signed)
Pt's states no medical or surgical changes since previsit or office visit. 

## 2018-04-04 ENCOUNTER — Telehealth: Payer: Self-pay | Admitting: *Deleted

## 2018-04-04 NOTE — Telephone Encounter (Signed)
  Follow up Call-  Call back number 04/03/2018  Post procedure Call Back phone  # [phone] -daughters cell ok to speak to her.  Permission to leave phone message Yes  Some recent data might be hidden     Patient questions:  Do you have a fever, pain , or abdominal swelling? No. Pain Score  0 *  Have you tolerated food without any problems? Yes.    Have you been able to return to your normal activities? Yes.    Do you have any questions about your discharge instructions: Diet   No. Medications  No. Follow up visit  Yes.    Do you have questions or concerns about your Care? No. Pt. Daughter was advised to schedule app. For 12 weeks with Dr. Lyndel Safe if his office had not contacted her before end of this week.  She verbalized understanding. Actions: * If pain score is 4 or above: No action needed, pain <4.

## 2018-04-04 NOTE — Telephone Encounter (Signed)
No answer, left message to call if questions or concerns. 

## 2018-04-07 ENCOUNTER — Encounter: Payer: Self-pay | Admitting: Gastroenterology

## 2018-04-10 ENCOUNTER — Telehealth: Payer: Self-pay

## 2018-04-10 ENCOUNTER — Encounter: Payer: Self-pay | Admitting: Gastroenterology

## 2018-04-10 NOTE — Telephone Encounter (Signed)
He is requesting the results of his colonoscopy and EGD.  Please advise.  Thank you.

## 2019-09-13 ENCOUNTER — Emergency Department (HOSPITAL_COMMUNITY)
Admission: EM | Admit: 2019-09-13 | Discharge: 2019-09-13 | Discharge status: Against Medical Advice | Payer: Medicare Other | Attending: Emergency Medicine | Admitting: Emergency Medicine

## 2019-09-13 ENCOUNTER — Encounter (HOSPITAL_COMMUNITY): Payer: Self-pay | Admitting: Emergency Medicine

## 2019-09-13 ENCOUNTER — Other Ambulatory Visit: Payer: Self-pay

## 2019-09-13 DIAGNOSIS — Z5320 Procedure and treatment not carried out because of patient's decision for unspecified reasons: Secondary | ICD-10-CM | POA: Insufficient documentation

## 2019-09-13 DIAGNOSIS — Z8673 Personal history of transient ischemic attack (TIA), and cerebral infarction without residual deficits: Secondary | ICD-10-CM | POA: Diagnosis not present

## 2019-09-13 DIAGNOSIS — I10 Essential (primary) hypertension: Secondary | ICD-10-CM | POA: Insufficient documentation

## 2019-09-13 DIAGNOSIS — R109 Unspecified abdominal pain: Secondary | ICD-10-CM | POA: Diagnosis present

## 2019-09-13 DIAGNOSIS — Z79899 Other long term (current) drug therapy: Secondary | ICD-10-CM | POA: Diagnosis not present

## 2019-09-13 DIAGNOSIS — R300 Dysuria: Secondary | ICD-10-CM | POA: Diagnosis not present

## 2019-09-13 LAB — URINALYSIS, ROUTINE W REFLEX MICROSCOPIC
Bilirubin Urine: NEGATIVE
Glucose, UA: NEGATIVE mg/dL
Ketones, ur: NEGATIVE mg/dL
Nitrite: NEGATIVE
Protein, ur: NEGATIVE mg/dL
Specific Gravity, Urine: 1.006 (ref 1.005–1.030)
WBC, UA: >50 WBC/hpf — ABNORMAL HIGH (ref 0–5)
pH: 5 (ref 5.0–8.0)

## 2019-09-13 NOTE — ED Provider Notes (Signed)
[facility] DEPT Provider Note   CSN: [id number] Arrival date & time: 09/13/19  1534     History   Chief Complaint Chief Complaint  Patient presents with  . bladder pressure    HPI Wendy Walsh is a 68 y.o. female.     Patient complains of dysuria for a few days.  Occasional flank pain.  No fever no chills no vomiting  The history is provided by the patient. No language interpreter was used.  Dysuria Pain quality:  Burning Pain severity:  Mild Onset quality:  Sudden Timing:  Constant Progression:  Worsening Chronicity:  New Recent urinary tract infections: no   Relieved by:  Nothing Worsened by:  Nothing Ineffective treatments:  None tried Associated symptoms: no abdominal pain     Past Medical History:  Diagnosis Date  . Essential hypertension 09/07/2017  . History of kidney stones    approx. 2008-- passed  . History of sepsis    03-30-2017  urosepsis due to stone obstruction  . Hyperlipidemia 09/07/2017  . Hypertension   . Left ureteral stone   . Wears glasses     Patient Active Problem List   Diagnosis Date Noted  . Essential hypertension 09/07/2017  . Hyperlipidemia 09/07/2017  . E coli bacteremia 04/03/2017  . AKI (acute kidney injury) ([place])   . Left ureteral stone   . Sepsis ([place])   . Septic shock ([place]) 03/30/2017    Past Surgical History:  Procedure Laterality Date  . CYSTOSCOPY WITH STENT PLACEMENT Left 03/30/2017   Procedure: CYSTOSCOPY WITH STENT PLACEMENT AND RETROGRADE;  Surgeon: Festus Aloe, MD;  Location: [facility];  Service: Urology;  Laterality: Left;  . CYSTOSCOPY/URETEROSCOPY/HOLMIUM LASER/STENT PLACEMENT Left 04/19/2017   Procedure: CYSTOSCOPY LEFT URETEROSCOPY/STENT EXCHANGE;  Surgeon: Festus Aloe, MD;  Location: [facility] ;  Service: Urology;  Laterality: Left;  . PARTIAL HYSTERECTOMY  1990s   approach unknown (vaginal? or abdominal?)  . TRANSTHORACIC ECHOCARDIOGRAM   04/01/2017   ef 55-60%,  grade 1 diastolic dysfunction/  trivial TR     OB History   No obstetric history on file.      Home Medications    Prior to Admission medications   Medication Sig Start Date End Date Taking? Authorizing Provider  amLODipine (NORVASC) 5 MG tablet Take 5 mg by mouth every morning.     [provider]  Cholecalciferol (VITAMIN D3) 5000 units TABS Take 5,000 Units by mouth daily.    [provider]  omeprazole (PRILOSEC) 20 MG capsule Take 1 capsule (20 mg total) by mouth daily. 03/17/18   Jackquline Denmark, MD  rosuvastatin (CRESTOR) 10 MG tablet Take 10 mg daily by mouth.    [provider]    Family History No family history on file.  Social History Social History   Tobacco Use  . Smoking status: Never Smoker  . Smokeless tobacco: Never Used  Substance Use Topics  . Alcohol use: No  . Drug use: No     Allergies   Patient has no known allergies.   Review of Systems Review of Systems  Constitutional: Negative for appetite change and fatigue.  HENT: Negative for congestion, ear discharge and sinus pressure.   Eyes: Negative for discharge.  Respiratory: Negative for cough.   Cardiovascular: Negative for chest pain.  Gastrointestinal: Negative for abdominal pain and diarrhea.  Genitourinary: Positive for dysuria. Negative for frequency and hematuria.  Musculoskeletal: Negative for back pain.  Skin: Negative for rash.  Neurological: Negative for  seizures and headaches.  Psychiatric/Behavioral: Negative for hallucinations.     Physical Exam Updated Vital Signs BP (!) 159/95   Pulse 74   Temp 98.3 F (36.8 C) (Oral)   Resp 18   SpO2 99%   Physical Exam Vitals signs and nursing note reviewed.  Constitutional:      Appearance: She is well-developed.  HENT:     Head: Normocephalic.     Nose: Nose normal.  Eyes:     General: No scleral icterus.    Conjunctiva/sclera: Conjunctivae normal.  Neck:      Musculoskeletal: Neck supple.     Thyroid: No thyromegaly.  Cardiovascular:     Rate and Rhythm: Normal rate and regular rhythm.     Heart sounds: No murmur. No friction rub. No gallop.   Pulmonary:     Breath sounds: No stridor. No wheezing or rales.  Chest:     Chest wall: No tenderness.  Abdominal:     General: There is no distension.     Tenderness: There is no abdominal tenderness. There is no rebound.  Musculoskeletal: Normal range of motion.  Lymphadenopathy:     Cervical: No cervical adenopathy.  Skin:    Findings: No erythema or rash.  Neurological:     Mental Status: She is oriented to person, place, and time.     Motor: No abnormal muscle tone.     Coordination: Coordination normal.  Psychiatric:        Behavior: Behavior normal.      ED Treatments / Results  Labs (all labs ordered are listed, but only abnormal results are displayed) Labs Reviewed  URINALYSIS, ROUTINE W REFLEX MICROSCOPIC  CBC WITH DIFFERENTIAL/PLATELET  BASIC METABOLIC PANEL    EKG None  Radiology No results found.  Procedures Procedures (including critical care time)  Medications Ordered in ED Medications - No data to display   Initial Impression / Assessment and Plan / ED Course  I have reviewed the triage vital signs and the nursing notes.  Pertinent labs & imaging results that were available during my care of the patient were reviewed by me and considered in my medical decision making (see chart for details).        Patient with dysuria.  Probable UTI.  Patient left AMA  Final Clinical Impressions(s) / ED Diagnoses   Final diagnoses:  Dysuria    ED Discharge Orders    None       Milton Ferguson, MD 09/13/19 1920

## 2019-09-13 NOTE — ED Triage Notes (Signed)
Pt having bladder pressure since yesterday. Hx UTIs and kidney stones.

## 2019-09-13 NOTE — ED Notes (Signed)
Pt and her visitor are upset the urine has not resulted.  Pt eloped.  MD made aware.

## 2019-11-13 ENCOUNTER — Other Ambulatory Visit: Payer: Medicare Other

## 2020-01-06 ENCOUNTER — Ambulatory Visit: Payer: Medicare Other

## 2020-01-06 ENCOUNTER — Ambulatory Visit: Payer: 59 | Attending: Internal Medicine

## 2020-01-06 DIAGNOSIS — Z23 Encounter for immunization: Secondary | ICD-10-CM | POA: Insufficient documentation

## 2020-01-06 NOTE — Progress Notes (Signed)
   Covid-19 Vaccination Clinic  Name:  Janina Hoffert    MRN: [id number] DOB: 18-May-1951  01/06/2020  Ms. Mcconahy was observed post Covid-19 immunization for 15 minutes without incident. She was provided with Vaccine Information Sheet and instruction to access the V-Safe system.   Ms. Yacko was instructed to call 911 with any severe reactions post vaccine: Marland Kitchen Difficulty breathing  . Swelling of face and throat  . A fast heartbeat  . A bad rash all over body  . Dizziness and weakness   Immunizations Administered    Name Date Dose VIS Date Route   Pfizer COVID-19 Vaccine 01/06/2020  9:00 AM 0.3 mL 10/12/2019 Intramuscular   Manufacturer: [place]   Lot: [id number]   [place]: [id number]

## 2020-02-06 ENCOUNTER — Ambulatory Visit: Payer: 59 | Attending: Internal Medicine

## 2020-02-06 DIAGNOSIS — Z23 Encounter for immunization: Secondary | ICD-10-CM

## 2020-02-06 NOTE — Progress Notes (Signed)
   Covid-19 Vaccination Clinic  Name:  Wendy Walsh    MRN: [id number] DOB: April 10, 1951  02/06/2020  [name] was observed post Covid-19 immunization for 15 minutes without incident. She was provided with Vaccine Information Sheet and instruction to access the V-Safe system.   [name] was instructed to call 911 with any severe reactions post vaccine: Marland Kitchen Difficulty breathing  . Swelling of face and throat  . A fast heartbeat  . A bad rash all over body  . Dizziness and weakness   Immunizations Administered    Name Date Dose VIS Date Route   Pfizer COVID-19 Vaccine 02/06/2020  8:09 AM 0.3 mL 10/12/2019 Intramuscular   Manufacturer: [place]   Lot: [id number]   [place]: [id number]

## 2020-03-05 IMAGING — CR DG LUMBAR SPINE COMPLETE 4+V
5 series · 5 of 5 positions shown · non-contrast
Comparison: CT abdomen and pelvis 10/28/2017

CLINICAL DATA: Low back pain with bilateral lower extremity
radiculopathy for 2 months.

EXAM:
LUMBAR SPINE - COMPLETE 4+ VIEW

[t lumbar spine ap]
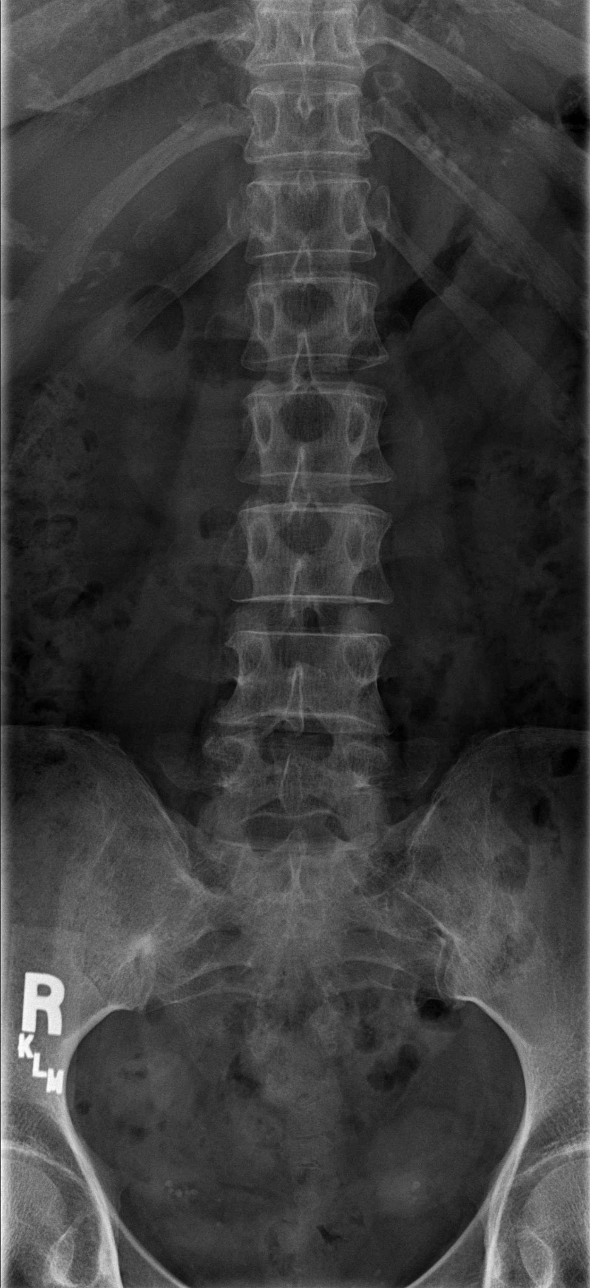

[t lumbar spine obl (1 of 2)]
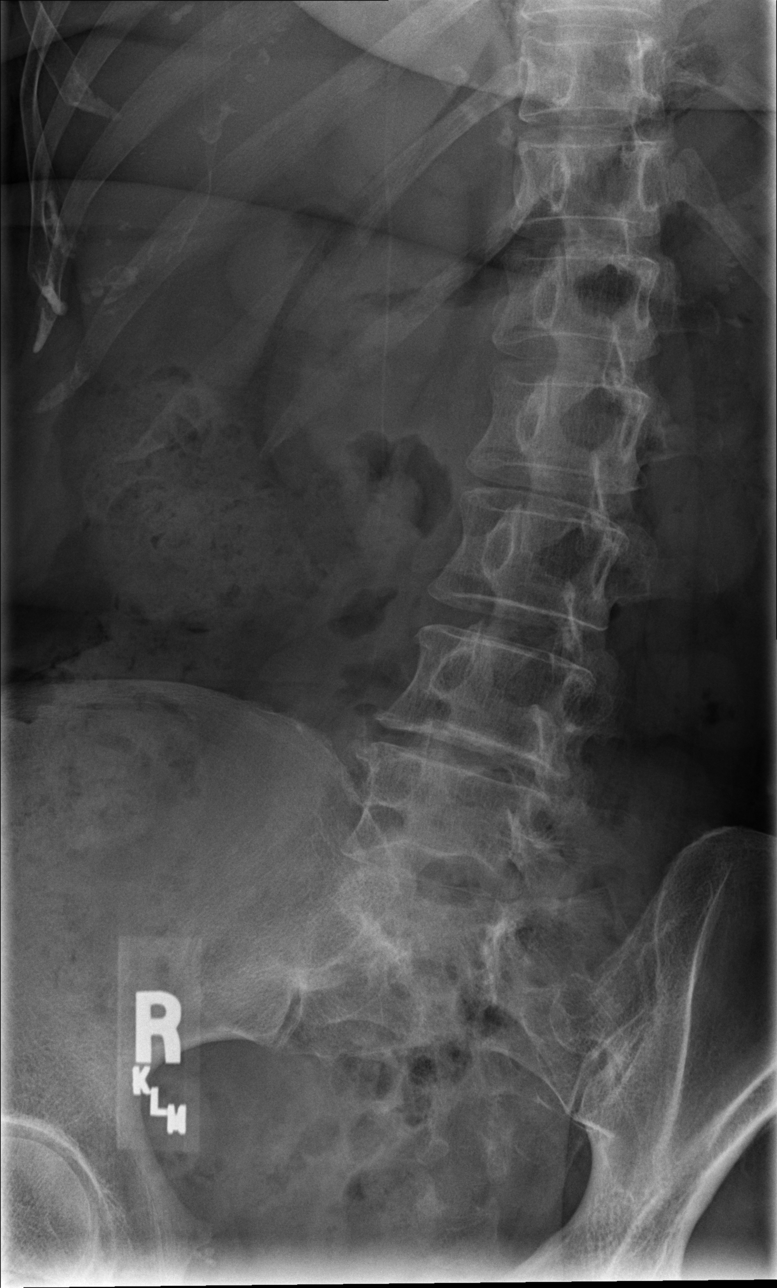

[t lumbar spine obl (2 of 2)]
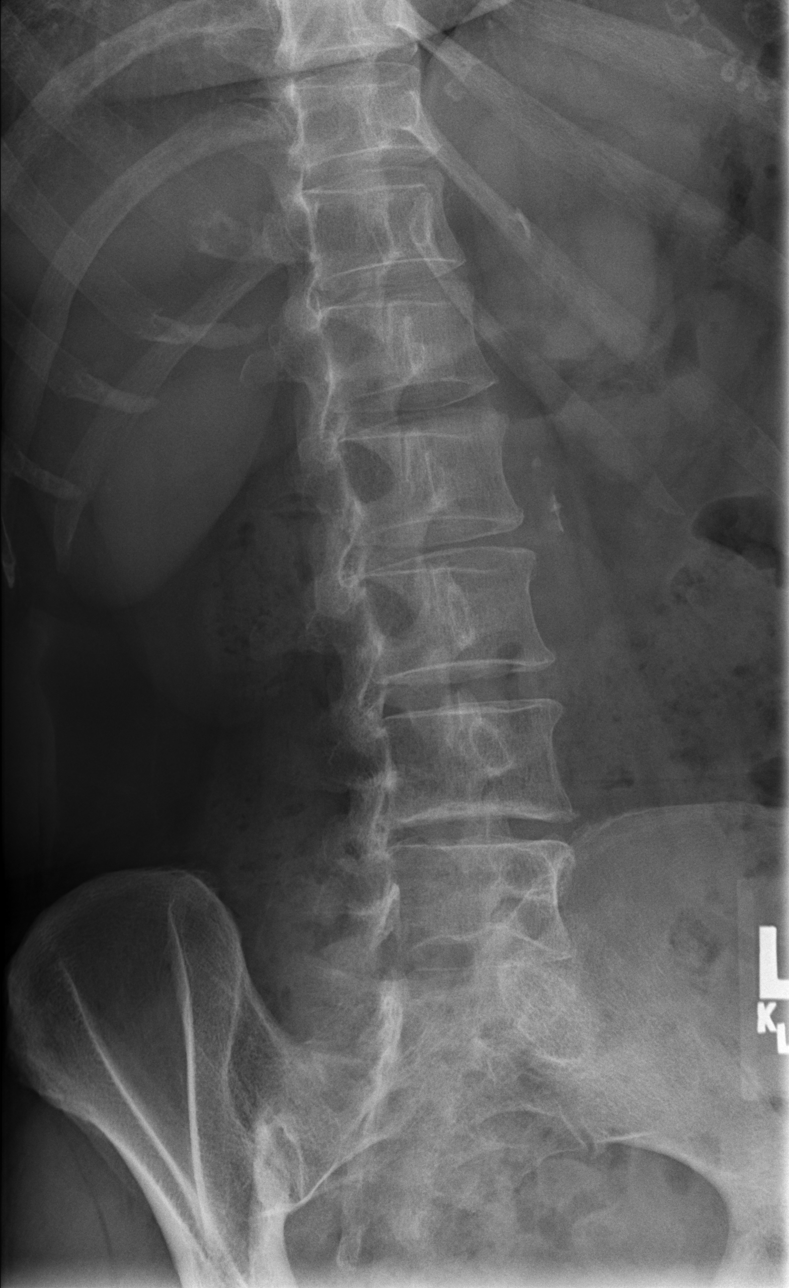

[t lumbar spine lat]
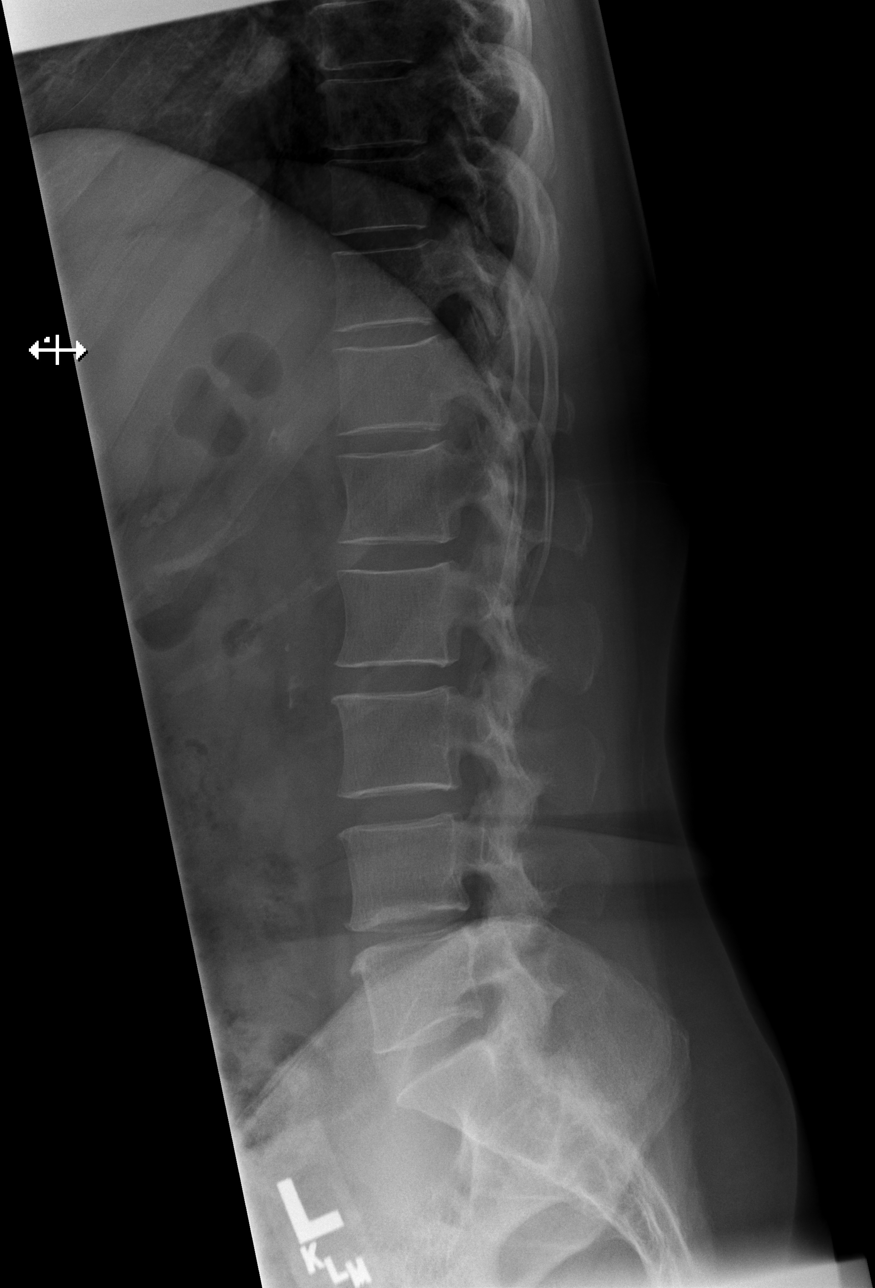

[t lumbar l-5 s-1 spot]
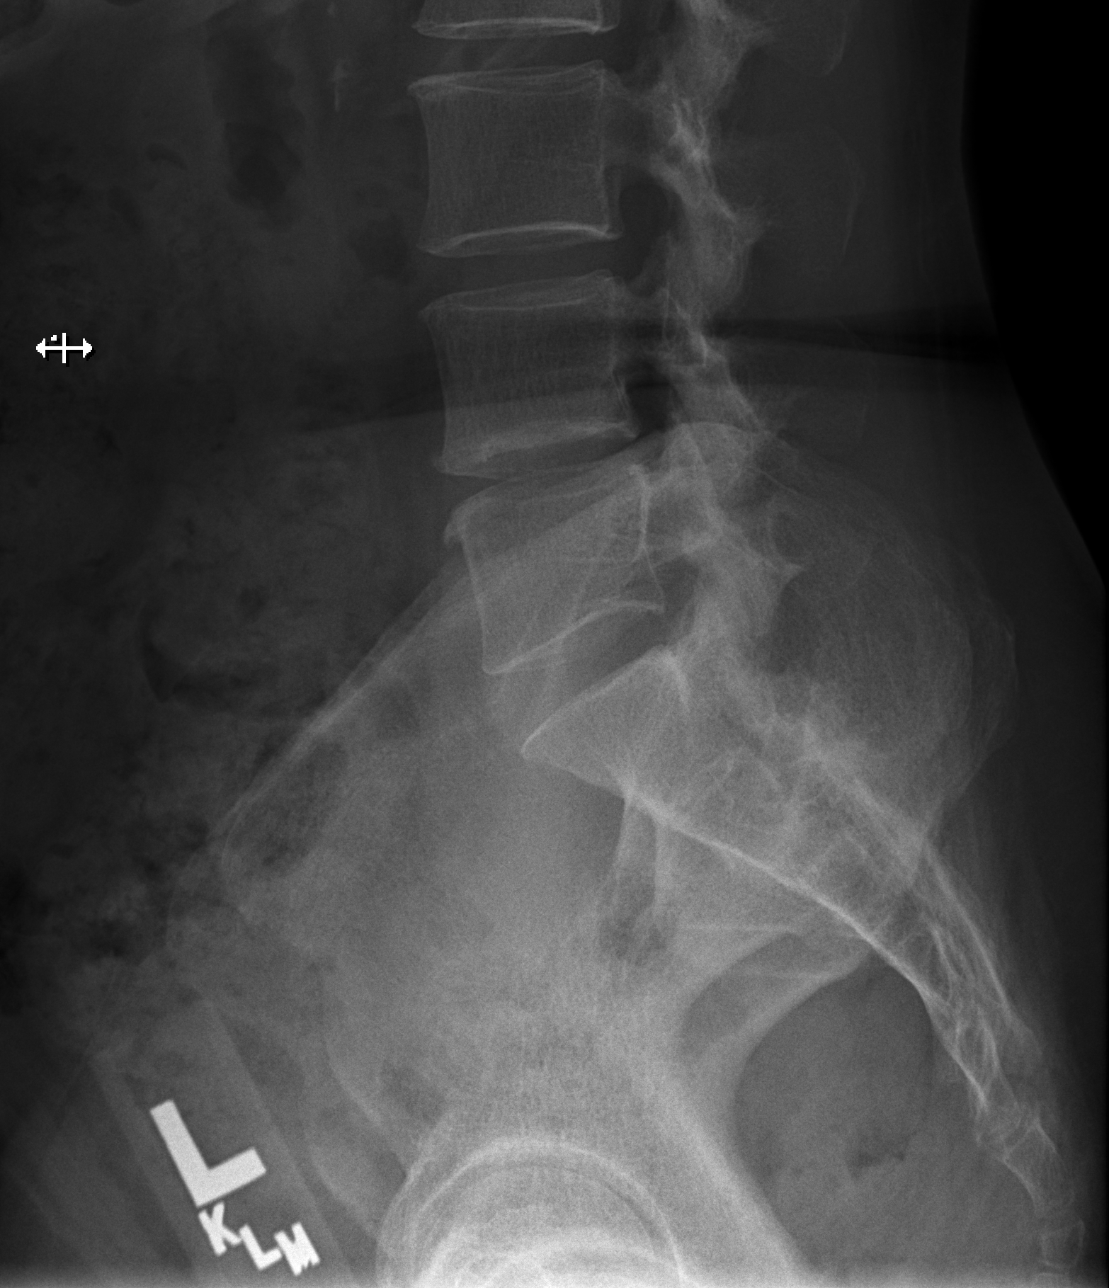

[5 of 5 positions shown; findings below may reference images not displayed]

FINDINGS: There 5 non rib-bearing lumbar type vertebrae. There is
straightening of the normal lumbar lordosis without listhesis.
Vertebral body heights are preserved. No fracture is identified.
There is mild chronic disc space narrowing and degenerative endplate
spurring at L4-5. Intervertebral disc space heights are preserved
elsewhere. At most mild lower lumbar facet arthrosis is noted. There
is mild abdominal aortic atherosclerosis.
IMPRESSION: Mild L4-5 disc degeneration without evidence of acute osseous
abnormality.

## 2020-09-11 ENCOUNTER — Other Ambulatory Visit: Payer: Self-pay | Admitting: Internal Medicine

## 2020-09-11 DIAGNOSIS — R102 Pelvic and perineal pain: Secondary | ICD-10-CM

## 2020-09-30 ENCOUNTER — Ambulatory Visit
Admission: RE | Admit: 2020-09-30 | Discharge: 2020-09-30 | Disposition: A | Discharge status: Home / Self Care | Payer: 59 | Source: Ambulatory Visit | Attending: Internal Medicine | Admitting: Internal Medicine

## 2020-09-30 ENCOUNTER — Other Ambulatory Visit: Payer: Self-pay

## 2020-09-30 DIAGNOSIS — R102 Pelvic and perineal pain: Secondary | ICD-10-CM

## 2020-09-30 DIAGNOSIS — R109 Unspecified abdominal pain: Secondary | ICD-10-CM

## 2020-09-30 MED ORDER — IOPAMIDOL (ISOVUE-300) INJECTION 61%
100.0000 mL | Freq: Once | INTRAVENOUS | Status: AC | PRN
  Administered 2020-09-30: 100 mL via INTRAVENOUS

## 2020-10-03 ENCOUNTER — Other Ambulatory Visit: Payer: Self-pay | Admitting: Emergency Medicine

## 2020-10-03 ENCOUNTER — Other Ambulatory Visit: Payer: Self-pay | Admitting: Internal Medicine

## 2020-10-03 DIAGNOSIS — N63 Unspecified lump in unspecified breast: Secondary | ICD-10-CM

## 2020-10-03 DIAGNOSIS — N6453 Retraction of nipple: Secondary | ICD-10-CM

## 2020-11-01 HISTORY — PX: MASTECTOMY: SHX3

## 2020-12-10 ENCOUNTER — Other Ambulatory Visit: Payer: Self-pay

## 2020-12-10 ENCOUNTER — Encounter (HOSPITAL_COMMUNITY): Payer: Self-pay

## 2020-12-10 DIAGNOSIS — R350 Frequency of micturition: Secondary | ICD-10-CM | POA: Insufficient documentation

## 2020-12-10 DIAGNOSIS — M545 Low back pain, unspecified: Secondary | ICD-10-CM | POA: Insufficient documentation

## 2020-12-10 DIAGNOSIS — Z79899 Other long term (current) drug therapy: Secondary | ICD-10-CM | POA: Diagnosis not present

## 2020-12-10 DIAGNOSIS — Z853 Personal history of malignant neoplasm of breast: Secondary | ICD-10-CM | POA: Diagnosis not present

## 2020-12-10 DIAGNOSIS — R300 Dysuria: Secondary | ICD-10-CM | POA: Insufficient documentation

## 2020-12-10 DIAGNOSIS — N898 Other specified noninflammatory disorders of vagina: Secondary | ICD-10-CM | POA: Diagnosis not present

## 2020-12-10 DIAGNOSIS — L292 Pruritus vulvae: Secondary | ICD-10-CM | POA: Diagnosis not present

## 2020-12-10 DIAGNOSIS — I10 Essential (primary) hypertension: Secondary | ICD-10-CM | POA: Insufficient documentation

## 2020-12-10 LAB — CBC
HCT: 42.8 % (ref 36.0–46.0)
Hemoglobin: 13.8 g/dL (ref 12.0–15.0)
MCH: 27 pg (ref 26.0–34.0)
MCHC: 32.2 g/dL (ref 30.0–36.0)
MCV: 83.8 fL (ref 80.0–100.0)
Platelets: 219 10*3/uL (ref 150–400)
RBC: 5.11 MIL/uL (ref 3.87–5.11)
RDW: 13.1 % (ref 11.5–15.5)
WBC: 6.6 10*3/uL (ref 4.0–10.5)
nRBC: 0 % (ref 0.0–0.2)

## 2020-12-10 LAB — BASIC METABOLIC PANEL
Anion gap: 12 (ref 5–15)
BUN: 9 mg/dL (ref 8–23)
CO2: 23 mmol/L (ref 22–32)
Calcium: 9.5 mg/dL (ref 8.9–10.3)
Chloride: 109 mmol/L (ref 98–111)
Creatinine, Ser: 0.67 mg/dL (ref 0.44–1.00)
GFR, Estimated: >60 mL/min (ref >60)
Glucose, Bld: 113 mg/dL — ABNORMAL HIGH (ref 70–99)
Potassium: 3.6 mmol/L (ref 3.5–5.1)
Sodium: 144 mmol/L (ref 135–145)

## 2020-12-10 LAB — URINALYSIS, ROUTINE W REFLEX MICROSCOPIC
Bilirubin Urine: NEGATIVE
Glucose, UA: NEGATIVE mg/dL
Hgb urine dipstick: NEGATIVE
Ketones, ur: NEGATIVE mg/dL
Leukocytes,Ua: NEGATIVE
Nitrite: NEGATIVE
Protein, ur: NEGATIVE mg/dL
Specific Gravity, Urine: 1.005 (ref 1.005–1.030)
pH: 7 (ref 5.0–8.0)

## 2020-12-10 NOTE — ED Triage Notes (Addendum)
Patient arrived with family who states she has been treated for possible uti, reports finishing antibiotics Sunday. Reporting lower back pain, vaginal itching, and urinary frequency. Last dose of tylenol at 7pm

## 2020-12-11 ENCOUNTER — Emergency Department (HOSPITAL_COMMUNITY)
Admission: EM | Admit: 2020-12-11 | Discharge: 2020-12-11 | Disposition: A | Discharge status: Home / Self Care | Payer: 59 | Attending: Emergency Medicine | Admitting: Emergency Medicine

## 2020-12-11 ENCOUNTER — Encounter (HOSPITAL_COMMUNITY): Payer: Self-pay | Admitting: Emergency Medicine

## 2020-12-11 DIAGNOSIS — R300 Dysuria: Secondary | ICD-10-CM | POA: Diagnosis not present

## 2020-12-11 HISTORY — DX: Malignant (primary) neoplasm, unspecified: C80.1

## 2020-12-11 HISTORY — DX: Malignant neoplasm of unspecified site of unspecified female breast: C50.919

## 2020-12-11 LAB — WET PREP, GENITAL
Clue Cells Wet Prep HPF POC: NONE SEEN
Sperm: NONE SEEN
Trich, Wet Prep: NONE SEEN
WBC, Wet Prep HPF POC: NONE SEEN
Yeast Wet Prep HPF POC: NONE SEEN

## 2020-12-11 MED ORDER — FLUCONAZOLE 150 MG PO TABS
150.0000 mg | ORAL_TABLET | Freq: Once | ORAL | Status: AC
  Administered 2020-12-11: 150 mg via ORAL
  Filled 2020-12-11: qty 1

## 2020-12-11 NOTE — ED Provider Notes (Signed)
Please see Dr. Burnett Sheng note for full H&P.  I performed external pelvic exam & swab for wet prep as patient preferred female provider for this.   Physical Exam Genitourinary:    Comments: NT  Asencion Partridge present as chaperone.  No external satellite lesions, vesicular lesions, or ulcerated areas. No significant skin break down. No palpable tenderness externally. No fluctuance/induration. No crepitus. Swab obtained for wet prep. No speculum/bimanual exam performed per discussion w/ attending.        Wendy Walsh 12/11/20 0132    Shanon Rosser, MD 12/11/20 [phone]

## 2020-12-11 NOTE — ED Provider Notes (Signed)
[place] DEPT Provider Note: Wendy Spurling, MD, FACEP  CSN: [id number] MRN: [id number] ARRIVAL: 12/10/20 at 2234 ROOM: WTR5/WTR5   CHIEF COMPLAINT  Urinary Tract Infection   HISTORY OF PRESENT ILLNESS  12/11/20 12:44 AM Wendy Walsh is a 71 y.o. female currently undergoing treatment for breast cancer at [facility].  She was recently diagnosed with a urinary tract infection due to urinary frequency and low back pain.  She was treated with Levaquin for 5 days, last dose 4 days ago.  She returns with continued low back pain (across the entire back), urinary frequency and vaginal itching.  She does not know if she is having a vaginal discharge.  She is not having a fever.  She rates her back pain is a 5 out of 10, aching in nature.   Past Medical History:  Diagnosis Date  . Breast cancer ([place])   . Essential hypertension 09/07/2017  . History of kidney stones    approx. 2008-- passed  . History of sepsis    03-30-2017  urosepsis due to stone obstruction  . Hyperlipidemia 09/07/2017  . Hypertension   . Left ureteral stone   . Wears glasses     Past Surgical History:  Procedure Laterality Date  . CYSTOSCOPY WITH STENT PLACEMENT Left 03/30/2017   Procedure: CYSTOSCOPY WITH STENT PLACEMENT AND RETROGRADE;  Surgeon: Festus Aloe, MD;  Location: [facility];  Service: Urology;  Laterality: Left;  . CYSTOSCOPY/URETEROSCOPY/HOLMIUM LASER/STENT PLACEMENT Left 04/19/2017   Procedure: CYSTOSCOPY LEFT URETEROSCOPY/STENT EXCHANGE;  Surgeon: Festus Aloe, MD;  Location: [facility];  Service: Urology;  Laterality: Left;  . PARTIAL HYSTERECTOMY  1990s   approach unknown (vaginal? or abdominal?)  . TRANSTHORACIC ECHOCARDIOGRAM  04/01/2017   ef 55-60%,  grade 1 diastolic dysfunction/  trivial TR    No family history on file.  Social History   Tobacco Use  . Smoking status: Never Smoker  . Smokeless tobacco: Never Used  Vaping Use  . Vaping Use: Never used   Substance Use Topics  . Alcohol use: No  . Drug use: No    Prior to Admission medications   Medication Sig Start Date End Date Taking? Authorizing Provider  amLODipine (NORVASC) 5 MG tablet Take 5 mg by mouth every morning.     [provider]  Cholecalciferol (VITAMIN D3) 5000 units TABS Take 5,000 Units by mouth daily.    [provider]  omeprazole (PRILOSEC) 20 MG capsule Take 1 capsule (20 mg total) by mouth daily. 03/17/18   Jackquline Denmark, MD  rosuvastatin (CRESTOR) 10 MG tablet Take 10 mg daily by mouth.    [provider]    Allergies Patient has no known allergies.   REVIEW OF SYSTEMS  Negative except as noted here or in the History of Present Illness.   PHYSICAL EXAMINATION  Initial Vital Signs Blood pressure (!) 169/90, pulse 88, temperature 98.5 F (36.9 C), temperature source Oral, resp. rate 19, SpO2 99 %.  Examination General: Well-developed, well-nourished female in no acute distress; appearance consistent with age of record HENT: normocephalic; atraumatic Eyes: pupils equal, round and reactive to light; extraocular muscles intact Neck: supple Heart: regular rate and rhythm Lungs: clear to auscultation bilaterally Abdomen: soft; nondistended; nontender; bowel sounds present GU: Bilateral CVA tenderness; pelvic exam performed by Kennith Maes, PA-C Extremities: No deformity; full range of motion Neurologic: Awake, alert and oriented; motor function intact in all extremities and symmetric; no facial droop Skin: Warm and dry Psychiatric: Normal mood and  affect   RESULTS  Summary of this visit's results, reviewed and interpreted by myself:   EKG Interpretation  Date/Time:    Ventricular Rate:    PR Interval:    QRS Duration:   QT Interval:    QTC Calculation:   R Axis:     Text Interpretation:        Laboratory Studies: Results for orders placed or performed during the hospital encounter of 12/11/20 (from the past  24 hour(s))  Urinalysis, Routine w reflex microscopic     Status: Abnormal   Collection Time: 12/10/20 10:50 PM  Result Value Ref Range   Color, Urine STRAW (A) YELLOW   APPearance CLEAR CLEAR   Specific Gravity, Urine 1.005 1.005 - 1.030   pH 7.0 5.0 - 8.0   Glucose, UA NEGATIVE NEGATIVE mg/dL   Hgb urine dipstick NEGATIVE NEGATIVE   Bilirubin Urine NEGATIVE NEGATIVE   Ketones, ur NEGATIVE NEGATIVE mg/dL   Protein, ur NEGATIVE NEGATIVE mg/dL   Nitrite NEGATIVE NEGATIVE   Leukocytes,Ua NEGATIVE NEGATIVE  CBC     Status: None   Collection Time: 12/10/20 11:01 PM  Result Value Ref Range   WBC 6.6 4.0 - 10.5 K/uL   RBC 5.11 3.87 - 5.11 MIL/uL   Hemoglobin 13.8 12.0 - 15.0 g/dL   HCT 42.8 36.0 - 46.0 %   MCV 83.8 80.0 - 100.0 fL   MCH 27.0 26.0 - 34.0 pg   MCHC 32.2 30.0 - 36.0 g/dL   RDW 13.1 11.5 - 15.5 %   Platelets 219 150 - 400 K/uL   nRBC 0.0 0.0 - 0.2 %  Basic metabolic panel     Status: Abnormal   Collection Time: 12/10/20 11:01 PM  Result Value Ref Range   Sodium 144 135 - 145 mmol/L   Potassium 3.6 3.5 - 5.1 mmol/L   Chloride 109 98 - 111 mmol/L   CO2 23 22 - 32 mmol/L   Glucose, Bld 113 (H) 70 - 99 mg/dL   BUN 9 8 - 23 mg/dL   Creatinine, Ser 0.67 0.44 - 1.00 mg/dL   Calcium 9.5 8.9 - 10.3 mg/dL   GFR, Estimated >60 >60 mL/min   Anion gap 12 5 - 15  Wet prep, genital     Status: None   Collection Time: 12/11/20  1:30 AM   Specimen: Vaginal  Result Value Ref Range   Yeast Wet Prep HPF POC NONE SEEN NONE SEEN   Trich, Wet Prep NONE SEEN NONE SEEN   Clue Cells Wet Prep HPF POC NONE SEEN NONE SEEN   WBC, Wet Prep HPF POC NONE SEEN NONE SEEN   Sperm NONE SEEN    Imaging Studies: No results found.  ED COURSE and MDM  Nursing notes, initial and subsequent vitals signs, including pulse oximetry, reviewed and interpreted by myself.  Vitals:   12/10/20 2250  BP: (!) 169/90  Pulse: 88  Resp: 19  Temp: 98.5 F (36.9 C)  TempSrc: Oral  SpO2: 99%    Medications  fluconazole (DIFLUCAN) tablet 150 mg (has no administration in time range)    The patient has no evidence of urinary tract infection, bacterial vaginosis, trichomoniasis or yeast infection on laboratory studies.  Her CBC and be met are also within normal limits.  The cause of her dysuria is unclear.  We will treat with a dose of Diflucan as a precaution.  The back pain is likely musculoskeletal and unrelated to her dysuria.  PROCEDURES  Procedures  ED DIAGNOSES     ICD-10-CM   1. Dysuria  R30.0        , Jenny Reichmann, MD 12/11/20 [phone]

## 2021-08-10 ENCOUNTER — Emergency Department (HOSPITAL_COMMUNITY)
Admission: EM | Admit: 2021-08-10 | Discharge: 2021-08-10 | Disposition: A | Discharge status: Home / Self Care | Payer: 59 | Attending: Emergency Medicine | Admitting: Emergency Medicine

## 2021-08-10 ENCOUNTER — Other Ambulatory Visit: Payer: Self-pay

## 2021-08-10 ENCOUNTER — Encounter (HOSPITAL_COMMUNITY): Payer: Self-pay

## 2021-08-10 DIAGNOSIS — W228XXA Striking against or struck by other objects, initial encounter: Secondary | ICD-10-CM | POA: Diagnosis not present

## 2021-08-10 DIAGNOSIS — Z5321 Procedure and treatment not carried out due to patient leaving prior to being seen by health care provider: Secondary | ICD-10-CM | POA: Diagnosis not present

## 2021-08-10 DIAGNOSIS — S0990XA Unspecified injury of head, initial encounter: Secondary | ICD-10-CM | POA: Diagnosis not present

## 2021-08-10 NOTE — ED Triage Notes (Signed)
Patient reports that she hit the top of her head on the trunk 3 days ago.  Patient denies any N/V, blurred vision. Patient does take a baby ASA.

## 2021-08-10 NOTE — ED Notes (Signed)
Pt returned their labels to registration and left

## 2022-12-09 ENCOUNTER — Encounter: Payer: Self-pay | Admitting: Internal Medicine

## 2023-05-18 ENCOUNTER — Encounter: Payer: Self-pay | Admitting: Gastroenterology

## 2023-05-19 ENCOUNTER — Telehealth: Payer: Self-pay | Admitting: *Deleted

## 2023-05-19 NOTE — Telephone Encounter (Signed)
Daughter called and notified there was no other appts that would be sooner than the October 22nd appt at 9:10am. Informed the daughter the New Patient appt slot was not to indicate that the patient was new, but this was the only slot or appt available. Daughter understood and agreed.

## 2023-05-31 NOTE — Telephone Encounter (Signed)
Looks like she may need a liver biopsy. MRI reviewed Lets get her into APP clinic or mine, first available (next week) RG

## 2023-06-08 NOTE — Progress Notes (Unsigned)
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  Patient follows with Dr. Welton Flakes oncology.  Patient diagnosed with needle core biopsy 10/2020.  She has undergone right mastectomy 11/2020.  Patient has had radiation and planning to start chemotherapy.  Last seen 05/18/2023 by Dr. Welton Flakes.  PET scan 04/13/2023 showed bone mets, tracer avid liver mets, cirrhosis  Recent MRI abdomen 05/09/2023 done for suspected liver metastasis showed coarse nodular cirrhotic contour of the liver.  Hypoenhancing lesions suspicious of a combination of chronic liver disease, fibrosis, diffuse metastatic disease.  Consider tissue sampling.  Diffuse osseous metastatic disease throughout vertebral bodies.  Gallbladder sludge without discrete gallstones      PREVIOUS GI WORKUP     Past Medical History:  Diagnosis Date   Breast cancer ([facility])    Essential hypertension 09/07/2017   History of kidney stones    approx. 2008-- passed   History of sepsis    03-30-2017  urosepsis due to stone obstruction   Hyperlipidemia 09/07/2017   Hypertension    Left ureteral stone    Wears glasses     Past Surgical History:  Procedure Laterality Date   CYSTOSCOPY WITH STENT PLACEMENT Left 03/30/2017   Procedure: CYSTOSCOPY WITH STENT PLACEMENT AND RETROGRADE;  Surgeon: Jerilee Field, MD;  Location: [facility];  Service: Urology;  Laterality: Left;   CYSTOSCOPY/URETEROSCOPY/HOLMIUM LASER/STENT PLACEMENT Left 04/19/2017   Procedure: CYSTOSCOPY LEFT URETEROSCOPY/STENT EXCHANGE;  Surgeon: Jerilee Field, MD;  Location: [facility];  Service: Urology;  Laterality: Left;   PARTIAL HYSTERECTOMY  1990s   approach unknown (vaginal? or abdominal?)   TRANSTHORACIC ECHOCARDIOGRAM  04/01/2017   ef 55-60%,  grade 1 diastolic dysfunction/  trivial TR    Current Outpatient  Medications  Medication Sig Dispense Refill   amLODipine (NORVASC) 5 MG tablet Take 5 mg by mouth every morning.      Cholecalciferol (VITAMIN D3) 5000 units TABS Take 5,000 Units by mouth daily.     omeprazole (PRILOSEC) 20 MG capsule Take 1 capsule (20 mg total) by mouth daily. 30 capsule 0   rosuvastatin (CRESTOR) 10 MG tablet Take 10 mg daily by mouth.     Current Facility-Administered Medications  Medication Dose Route Frequency Provider Last Rate Last Admin   0.9 %  sodium chloride infusion  500 mL Intravenous Once Lynann Bologna, MD        Allergies as of 06/09/2023   (No Known Allergies)    No family history on file.  Social History   Socioeconomic History   Marital status: Widowed    Spouse name: Not on file   Number of children: Not on file   Years of education: Not on file   Highest education level: Not on file  Occupational History   Not on file  Tobacco Use   Smoking status: Never   Smokeless tobacco: Never  Vaping Use   Vaping status: Never Used  Substance and Sexual Activity   Alcohol use: No   Drug use: No   Sexual activity: Never  Other Topics Concern   Not on file  Social History Narrative   Originally from [place]   Widowed   Lives at home with daughter, Elvera Lennox, and Janki's daughter, who is currently 23 yo (2018)   Social Determinants of Health   Financial Resource Strain: Not on file  Food Insecurity: Not on file  Transportation Needs: Not on file  Physical Activity: Not on file  Stress: Not on file  Social Connections: Not on file  Intimate Partner Violence: Not on file    Review of Systems:    Constitutional: No weight loss, fever, chills, weakness or fatigue HEENT: Eyes: No change in vision               Ears, Nose, Throat:  No change in hearing or congestion Skin: No rash or itching Cardiovascular: No chest pain, chest pressure or palpitations   Respiratory: No SOB or cough Gastrointestinal: See HPI and otherwise  negative Genitourinary: No dysuria or change in urinary frequency Neurological: No headache, dizziness or syncope Musculoskeletal: No new muscle or joint pain Hematologic: No bleeding or bruising Psychiatric: No history of depression or anxiety    Physical Exam:  Vital signs: There were no vitals taken for this visit.  Constitutional: NAD, Well developed, Well nourished, alert and cooperative Head:  Normocephalic and atraumatic. Eyes:   PEERL, EOMI. No icterus. Conjunctiva pink. Respiratory: Respirations even and unlabored. Lungs clear to auscultation bilaterally.   No wheezes, crackles, or rhonchi.  Cardiovascular:  Regular rate and rhythm. No peripheral edema, cyanosis or pallor.  Gastrointestinal:  Soft, nondistended, nontender. No rebound or guarding. Normal bowel sounds. No appreciable masses or hepatomegaly. Rectal:  Not performed.  Msk:  Symmetrical without gross deformities. Without edema, no deformity or joint abnormality.  Neurologic:  Alert and  oriented x4;  grossly normal neurologically.  Skin:   Dry and intact without significant lesions or rashes. Psychiatric: Oriented to person, place and time. Demonstrates good judgement and reason without abnormal affect or behaviors.   RELEVANT LABS AND IMAGING: CBC    Component Value Date/Time   WBC 6.6 12/10/2020 2301   RBC 5.11 12/10/2020 2301   HGB 13.8 12/10/2020 2301   HGB 14.2 09/06/2017 1650   HCT 42.8 12/10/2020 2301   HCT 42.1 09/06/2017 1650   PLT 219 12/10/2020 2301   PLT 257 09/06/2017 1650   MCV 83.8 12/10/2020 2301   MCV 83 09/06/2017 1650   MCH 27.0 12/10/2020 2301   MCHC 32.2 12/10/2020 2301   RDW 13.1 12/10/2020 2301   RDW 13.7 09/06/2017 1650   LYMPHSABS 1.8 10/28/2017 1442   LYMPHSABS 2.3 09/06/2017 1650   MONOABS 0.7 10/28/2017 1442   EOSABS 0.1 10/28/2017 1442   EOSABS 0.0 09/06/2017 1650   BASOSABS 0.0 10/28/2017 1442   BASOSABS 0.0 09/06/2017 1650    CMP     Component Value Date/Time    NA 144 12/10/2020 2301   NA 144 09/06/2017 1650   K 3.6 12/10/2020 2301   CL 109 12/10/2020 2301   CO2 23 12/10/2020 2301   GLUCOSE 113 (H) 12/10/2020 2301   BUN 9 12/10/2020 2301   BUN 10 09/06/2017 1650   CREATININE 0.67 12/10/2020 2301   CALCIUM 9.5 12/10/2020 2301   PROT 8.2 (H) 10/28/2017 1442   PROT 8.2 09/06/2017 1650   ALBUMIN 4.0 10/28/2017 1442   ALBUMIN 5.1 (H) 09/06/2017 1650   AST 31 10/28/2017 1442   ALT 29 10/28/2017 1442   ALKPHOS 65 10/28/2017 1442   BILITOT 1.9 (H) 10/28/2017 1442   BILITOT 1.5 (H) 09/06/2017 1650   GFRNONAA >60 12/10/2020 2301   GFRAA >60 10/28/2017 1442     Assessment/Plan:       Lara [facility]ch [facility] 06/08/2023, 3:25 PM  Cc: Galvin Proffer, MD

## 2023-06-09 ENCOUNTER — Other Ambulatory Visit: Payer: 59

## 2023-06-09 ENCOUNTER — Ambulatory Visit (INDEPENDENT_AMBULATORY_CARE_PROVIDER_SITE_OTHER): Payer: 59 | Admitting: Gastroenterology

## 2023-06-09 ENCOUNTER — Encounter: Payer: Self-pay | Admitting: Gastroenterology

## 2023-06-09 VITALS — BP 118/68 | HR 67 | Ht 64.0 in | Wt 126.2 lb

## 2023-06-09 DIAGNOSIS — K7469 Other cirrhosis of liver: Secondary | ICD-10-CM | POA: Diagnosis not present

## 2023-06-09 LAB — CBC WITH DIFFERENTIAL/PLATELET
Basophils Absolute: 0 10*3/uL (ref 0.0–0.1)
Basophils Relative: 0.8 % (ref 0.0–3.0)
Eosinophils Absolute: 0.1 10*3/uL (ref 0.0–0.7)
Eosinophils Relative: 1.8 % (ref 0.0–5.0)
HCT: 41.5 % (ref 36.0–46.0)
Hemoglobin: 13.5 g/dL (ref 12.0–15.0)
Lymphocytes Relative: 29.1 % (ref 12.0–46.0)
Lymphs Abs: 0.9 10*3/uL (ref 0.7–4.0)
MCHC: 32.6 g/dL (ref 30.0–36.0)
MCV: 85.8 fl (ref 78.0–100.0)
Monocytes Absolute: 0.4 10*3/uL (ref 0.1–1.0)
Monocytes Relative: 11 % (ref 3.0–12.0)
Neutro Abs: 1.8 10*3/uL (ref 1.4–7.7)
Neutrophils Relative %: 57.3 % (ref 43.0–77.0)
Platelets: 142 10*3/uL — ABNORMAL LOW (ref 150.0–400.0)
RBC: 4.84 Mil/uL (ref 3.87–5.11)
RDW: 14.9 % (ref 11.5–15.5)
WBC: 3.2 10*3/uL — ABNORMAL LOW (ref 4.0–10.5)

## 2023-06-09 LAB — COMPREHENSIVE METABOLIC PANEL
ALT: 50 U/L — ABNORMAL HIGH (ref 0–35)
AST: 83 U/L — ABNORMAL HIGH (ref 0–37)
Albumin: 4.2 g/dL (ref 3.5–5.2)
Alkaline Phosphatase: 215 U/L — ABNORMAL HIGH (ref 39–117)
BUN: 13 mg/dL (ref 6–23)
CO2: 26 mEq/L (ref 19–32)
Calcium: 9.5 mg/dL (ref 8.4–10.5)
Chloride: 106 mEq/L (ref 96–112)
Creatinine, Ser: 0.76 mg/dL (ref 0.40–1.20)
GFR: 79.39 mL/min (ref >60.00)
Glucose, Bld: 105 mg/dL — ABNORMAL HIGH (ref 70–99)
Potassium: 4.2 mEq/L (ref 3.5–5.1)
Sodium: 140 mEq/L (ref 135–145)
Total Bilirubin: 1.1 mg/dL (ref 0.2–1.2)
Total Protein: 8.2 g/dL (ref 6.0–8.3)

## 2023-06-09 LAB — PROTIME-INR
INR: 1.2 ratio — ABNORMAL HIGH (ref 0.8–1.0)
Prothrombin Time: 12.4 s (ref 9.6–13.1)

## 2023-06-09 LAB — IBC + FERRITIN
Ferritin: 273.9 ng/mL (ref 10.0–291.0)
Iron: 120 ug/dL (ref 42–145)
Saturation Ratios: 41.6 % (ref 20.0–50.0)
TIBC: 288.4 ug/dL (ref 250.0–450.0)
Transferrin: 206 mg/dL — ABNORMAL LOW (ref 212.0–360.0)

## 2023-06-09 NOTE — Progress Notes (Signed)
Agree with assessment/plan.  Raj , MD [facility] [phone]  

## 2023-06-09 NOTE — Patient Instructions (Addendum)
You will be contacted by [facility] Scheduling in the next 2 days to arrange a US IR Liver Biopsy.  The number on your caller ID will be [phone], please answer when they call.  If you have not heard from them in 2 days please call [phone] to schedule.   IR Biopsy Scheduler will be calling you to schedule  Your provider has requested that you go to the basement level for lab work before leaving today. Press "B" on the elevator. The lab is located at the first door on the left as you exit the elevator.   Keep scheduled follow up appointment with Dr Chales Abrahams   Due to recent changes in healthcare laws, you may see the results of your imaging and laboratory studies on MyChart before your provider has had a chance to review them.  We understand that in some cases there may be results that are confusing or concerning to you. Not all laboratory results come back in the same time frame and the provider may be waiting for multiple results in order to interpret others.  Please give [place] 48 hours in order for your provider to thoroughly review all the results before contacting the office for clarification of your results.    _______________________________________________________  If your blood pressure at your visit was 140/90 or greater, please contact your primary care physician to follow up on this.  _______________________________________________________  If you are age 70 or older, your body mass index should be between 23-30. Your Body mass index is 21.66 kg/m. If this is out of the aforementioned range listed, please consider follow up with your Primary Care Provider.  If you are age 33 or younger, your body mass index should be between 19-25. Your Body mass index is 21.66 kg/m. If this is out of the aformentioned range listed, please consider follow up with your Primary Care Provider.   ________________________________________________________  The  GI providers would like to  encourage you to use [facility] to communicate with providers for non-urgent requests or questions.  Due to long hold times on the telephone, sending your provider a message by [facility] may be a faster and more efficient way to get a response.  Please allow 48 business hours for a response.  Please remember that this is for non-urgent requests.  _______________________________________________________   I appreciate the  opportunity to care for you  Thank You   [facility]

## 2023-06-13 ENCOUNTER — Encounter: Payer: Self-pay | Admitting: Gastroenterology

## 2023-06-15 ENCOUNTER — Encounter: Payer: Self-pay | Admitting: *Deleted

## 2023-06-20 ENCOUNTER — Other Ambulatory Visit: Payer: Self-pay | Admitting: Student

## 2023-06-20 DIAGNOSIS — K7460 Unspecified cirrhosis of liver: Secondary | ICD-10-CM

## 2023-06-21 ENCOUNTER — Encounter (HOSPITAL_COMMUNITY): Payer: Self-pay

## 2023-06-21 ENCOUNTER — Ambulatory Visit (HOSPITAL_COMMUNITY)
Admission: RE | Admit: 2023-06-21 | Discharge: 2023-06-21 | Disposition: A | Discharge status: Home / Self Care | Payer: 59 | Source: Ambulatory Visit | Attending: Gastroenterology | Admitting: Gastroenterology

## 2023-06-21 DIAGNOSIS — Z853 Personal history of malignant neoplasm of breast: Secondary | ICD-10-CM | POA: Diagnosis not present

## 2023-06-21 DIAGNOSIS — C787 Secondary malignant neoplasm of liver and intrahepatic bile duct: Secondary | ICD-10-CM | POA: Insufficient documentation

## 2023-06-21 DIAGNOSIS — K7469 Other cirrhosis of liver: Secondary | ICD-10-CM

## 2023-06-21 DIAGNOSIS — K7460 Unspecified cirrhosis of liver: Secondary | ICD-10-CM

## 2023-06-21 DIAGNOSIS — R160 Hepatomegaly, not elsewhere classified: Secondary | ICD-10-CM | POA: Diagnosis present

## 2023-06-21 LAB — CBC
HCT: 43.2 % (ref 36.0–46.0)
Hemoglobin: 14.1 g/dL (ref 12.0–15.0)
MCH: 28 pg (ref 26.0–34.0)
MCHC: 32.6 g/dL (ref 30.0–36.0)
MCV: 85.9 fL (ref 80.0–100.0)
Platelets: 201 K/uL (ref 150–400)
RBC: 5.03 MIL/uL (ref 3.87–5.11)
RDW: 14.5 % (ref 11.5–15.5)
WBC: 4.3 K/uL (ref 4.0–10.5)
nRBC: 0 % (ref 0.0–0.2)

## 2023-06-21 LAB — PROTIME-INR
INR: 1.1 (ref 0.8–1.2)
Prothrombin Time: 14.2 s (ref 11.4–15.2)

## 2023-06-21 MED ORDER — SODIUM CHLORIDE 0.9 % IV SOLN: INTRAVENOUS | Status: DC

## 2023-06-21 MED ORDER — LIDOCAINE HCL (PF) 1 % IJ SOLN
5.0000 mL | Freq: Once | INTRAMUSCULAR | Status: AC
  Administered 2023-06-21: 5 mL via INTRADERMAL

## 2023-06-21 MED ORDER — FENTANYL CITRATE (PF) 100 MCG/2ML IJ SOLN
INTRAMUSCULAR | Status: AC | PRN
Start: 2023-06-21 — End: 2023-06-21
  Administered 2023-06-21: 25 ug via INTRAVENOUS

## 2023-06-21 MED ORDER — MIDAZOLAM HCL 2 MG/2ML IJ SOLN
INTRAMUSCULAR | Status: AC
  Filled 2023-06-21: qty 4

## 2023-06-21 MED ORDER — GELATIN ABSORBABLE 12-7 MM EX MISC
1.0000 | Freq: Once | CUTANEOUS | Status: AC
  Administered 2023-06-21: 1 via TOPICAL

## 2023-06-21 MED ORDER — SODIUM CHLORIDE 0.9 % IV SOLN
INTRAVENOUS | Status: AC | PRN
  Administered 2023-06-21: 10 mL/h via INTRAVENOUS

## 2023-06-21 MED ORDER — FENTANYL CITRATE (PF) 100 MCG/2ML IJ SOLN
INTRAMUSCULAR | Status: AC
  Filled 2023-06-21: qty 2

## 2023-06-21 MED ORDER — MIDAZOLAM HCL 2 MG/2ML IJ SOLN
INTRAMUSCULAR | Status: AC | PRN
Start: 2023-06-21 — End: 2023-06-21
  Administered 2023-06-21: 1 mg via INTRAVENOUS

## 2023-06-21 NOTE — H&P (Signed)
Chief Complaint: Patient was seen in consultation today for liver lesion biopsy at the request of McMichael,Bayley M  Referring Physician(s): McMichael,Bayley M Dr  [facility]  Supervising Physician: Gilmer Mor  Patient Status: [facility] - Out-pt  History of Present Illness: Wendy Walsh is a 72 y.o. female   FULL Code status per pt and Dtr Hx Breast Cancer 2021 New elevated LFTs Follows with PA B McMichael  PET scan 04/13/2023 : IMPRESSION: 1. No signs of tracer avid metastatic disease. 2. Innumerable small, predominantly sclerotic lesions are identified throughout the visualized axial and appendicular skeleton. These are favored to represent treated bone metastases. 3. No abnormal foci of increased uptake within the liver, above background liver activity to suggest tracer avid liver metastases. 4. Nodular contours of the liver suggestive of cirrhosis. 5. Aortic Atherosclerosis (ICD10-I70.0).  MR 05/09/23: IMPRESSION: 1. Coarse, nodular cirrhotic contour of the liver. 2. Very heterogeneous parenchymal contrast enhancement pattern, with a background of underlying irregular, reticular T2 hyperintensity throughout. On portal venous phase of contrast, there are some particularly conspicuous focally hypoenhancing lesions, generally with a background of innumerable similar, smaller lesions. 3. Generally suspect that this appearance indeed reflects some combination of chronic liver disease, fibrosis and diffuse metastatic disease despite lack of abnormal FDG avidity on prior PET-CT. Consider tissue sampling to definitively establish the presence of liver metastatic disease if significant for treatment decisions. 4. Diffuse, heterogeneously enhancing osseous metastatic disease throughout the included vertebral bodies. 5. Gallbladder sludge without discrete gallstones.  Request made for liver biopsy in IR today Cirrhosis; liver lesions Dr Loreta Ave has  reviewed imaging and approves liver lesion biopsy     Past Medical History:  Diagnosis Date   Breast cancer [facility])    Essential hypertension 09/07/2017   History of kidney stones    approx. 2008-- passed   History of sepsis    03-30-2017  urosepsis due to stone obstruction   Hyperlipidemia 09/07/2017   Hypertension    Left ureteral stone    Wears glasses     Past Surgical History:  Procedure Laterality Date   CYSTOSCOPY WITH STENT PLACEMENT Left 03/30/2017   Procedure: CYSTOSCOPY WITH STENT PLACEMENT AND RETROGRADE;  Surgeon: Jerilee Field, MD;  Location: [facility];  Service: Urology;  Laterality: Left;   CYSTOSCOPY/URETEROSCOPY/HOLMIUM LASER/STENT PLACEMENT Left 04/19/2017   Procedure: CYSTOSCOPY LEFT URETEROSCOPY/STENT EXCHANGE;  Surgeon: Jerilee Field, MD;  Location: [facility];  Service: Urology;  Laterality: Left;   PARTIAL HYSTERECTOMY  1990s   approach unknown (vaginal? or abdominal?)   TRANSTHORACIC ECHOCARDIOGRAM  04/01/2017   ef 55-60%,  grade 1 diastolic dysfunction/  trivial TR    Allergies: Patient has no known allergies.  Medications: Prior to Admission medications   Medication Sig Start Date End Date Taking? Authorizing Provider  amLODipine (NORVASC) 5 MG tablet Take 5 mg by mouth every morning.    Yes [provider]  anastrozole (ARIMIDEX) 1 MG tablet Take 1 mg by mouth daily.   Yes [provider]  Cholecalciferol (VITAMIN D3) 5000 units TABS Take 5,000 Units by mouth daily.   Yes [provider]  ibandronate (BONIVA) 150 MG tablet Take 150 mg by mouth every 30 (thirty) days. Take in the morning with a full glass of water, on an empty stomach, and do not take anything else by mouth or lie down for the next 30 min.   Yes [provider]  omeprazole (PRILOSEC) 20 MG capsule Take 1 capsule (20 mg total) by mouth  daily. 03/17/18   Lynann Bologna, MD  rosuvastatin (CRESTOR) 10 MG tablet Take 10 mg daily by  mouth. Patient not taking: Reported on 06/09/2023    [provider]     History reviewed. No pertinent family history.  Social History   Socioeconomic History   Marital status: Widowed    Spouse name: Not on file   Number of children: Not on file   Years of education: Not on file   Highest education level: Not on file  Occupational History   Not on file  Tobacco Use   Smoking status: Never   Smokeless tobacco: Never  Vaping Use   Vaping status: Never Used  Substance and Sexual Activity   Alcohol use: No   Drug use: No   Sexual activity: Never  Other Topics Concern   Not on file  Social History Narrative   Originally from [place]   Widowed   Lives at home with daughter, Wendy Walsh, and Wendy Walsh's daughter, who is currently 2 yo (2018)   Social Determinants of Health   Financial Resource Strain: Not on file  Food Insecurity: Not on file  Transportation Needs: Not on file  Physical Activity: Not on file  Stress: Not on file  Social Connections: Not on file    Review of Systems: A 12 point ROS discussed and pertinent positives are indicated in the HPI above.  All other systems are negative.  Review of Systems  Constitutional:  Negative for activity change and fatigue.  Respiratory:  Negative for cough and shortness of breath.   Cardiovascular:  Negative for chest pain.  Gastrointestinal:  Negative for abdominal pain.  Neurological:  Negative for weakness.  Psychiatric/Behavioral:  Negative for behavioral problems and confusion.     Vital Signs: BP (!) 154/91   Pulse 69   Temp 98.3 F (36.8 C) (Temporal)   Resp 16   Ht 5\' 4"  (1.626 m)   Wt 126 lb 1.7 oz (57.2 kg)   SpO2 100%   BMI 21.65 kg/m   Advance Care Plan: The advanced care plan/surrogate decision maker was discussed at the time of visit and documented in the medical record.    Physical Exam Vitals reviewed.  HENT:     Mouth/Throat:     Mouth: Mucous membranes are moist.  Cardiovascular:      Rate and Rhythm: Normal rate and regular rhythm.     Heart sounds: Normal heart sounds.  Pulmonary:     Effort: Pulmonary effort is normal.     Breath sounds: Normal breath sounds.  Abdominal:     Palpations: Abdomen is soft.  Musculoskeletal:        General: Normal range of motion.  Skin:    General: Skin is warm.  Neurological:     Mental Status: She is alert and oriented to person, place, and time.  Psychiatric:        Behavior: Behavior normal.     Comments: Dtr at bedside Interprets for mother     Imaging: No results found.  Labs:  CBC: Recent Labs    06/09/23 1059  WBC 3.2*  HGB 13.5  HCT 41.5  PLT 142.0*    COAGS: Recent Labs    06/09/23 1059  INR 1.2*    BMP: Recent Labs    06/09/23 1059  NA 140  K 4.2  CL 106  CO2 26  GLUCOSE 105*  BUN 13  CALCIUM 9.5  CREATININE 0.76    LIVER FUNCTION TESTS: Recent Labs  06/09/23 1059  BILITOT 1.1  AST 83*  ALT 50*  ALKPHOS 215*  PROT 8.2  ALBUMIN 4.2    TUMOR MARKERS: Recent Labs    06/09/23 1059  AFPTM 6.4*    Assessment and Plan:  Scheduled for liver lesion biopsy Risks and benefits of liver lesion biopsy was discussed with the patient and/or patient's family including, but not limited to bleeding, infection, damage to adjacent structures or low yield requiring additional tests.  All of the questions were answered and there is agreement to proceed.  Consent signed and in chart.  Thank you for this interesting consult.  I greatly enjoyed meeting Wendy Walsh and look forward to participating in their care.  A copy of this report was sent to the requesting provider on this date.  Electronically Signed: Robet Leu, PA-C 06/21/2023, 12:34 PM   I spent a total of  30 Minutes   in face to face in clinical consultation, greater than 50% of which was counseling/coordinating care for liver lesion biopsy

## 2023-06-21 NOTE — Procedures (Signed)
Interventional Radiology Procedure Note  Procedure: US guided biopsy of liver, targeting heterogeneous region in the right liver.  Complications: None EBL: None Recommendations: - Bedrest 2 hours.   - Routine wound care - Follow up pathology - Advance diet   Signed,  Gilmer Mor, DO

## 2023-06-23 ENCOUNTER — Ambulatory Visit (INDEPENDENT_AMBULATORY_CARE_PROVIDER_SITE_OTHER): Payer: 59 | Admitting: Gastroenterology

## 2023-06-23 DIAGNOSIS — Z9229 Personal history of other drug therapy: Secondary | ICD-10-CM

## 2023-06-23 DIAGNOSIS — Z23 Encounter for immunization: Secondary | ICD-10-CM | POA: Diagnosis not present

## 2023-06-23 LAB — SURGICAL PATHOLOGY

## 2023-07-28 ENCOUNTER — Ambulatory Visit: Payer: 59 | Admitting: Gastroenterology

## 2023-07-28 DIAGNOSIS — Z23 Encounter for immunization: Secondary | ICD-10-CM | POA: Diagnosis not present

## 2023-07-28 DIAGNOSIS — K7469 Other cirrhosis of liver: Secondary | ICD-10-CM

## 2023-08-23 ENCOUNTER — Encounter: Payer: Self-pay | Admitting: Gastroenterology

## 2023-08-23 ENCOUNTER — Ambulatory Visit (INDEPENDENT_AMBULATORY_CARE_PROVIDER_SITE_OTHER): Payer: 59 | Admitting: Gastroenterology

## 2023-08-23 VITALS — BP 118/74 | HR 78 | Ht 64.0 in | Wt 122.2 lb

## 2023-08-23 DIAGNOSIS — C50919 Malignant neoplasm of unspecified site of unspecified female breast: Secondary | ICD-10-CM | POA: Diagnosis not present

## 2023-08-23 DIAGNOSIS — C787 Secondary malignant neoplasm of liver and intrahepatic bile duct: Secondary | ICD-10-CM | POA: Diagnosis not present

## 2023-08-23 DIAGNOSIS — K7460 Unspecified cirrhosis of liver: Secondary | ICD-10-CM

## 2023-08-23 NOTE — Progress Notes (Signed)
Chief Complaint: FU  Referring Provider:  Galvin Proffer, MD      ASSESSMENT AND PLAN;   #1. Metastatic breast ca (to liver, bone)  #2. 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    Plan: -FU in 3 months. -She is being closely followed by oncology. -Trend LFTs.   HPI:    Wendy Walsh is a 72 y.o. female  With metastatic breast cancer (being followed by Dr. Welton Flakes (oncology) on Faslodex/Ibrance FU from OV with [facility]  PET CT 04/2019/MRI Abdo 05/2023 was consistent with several liver metastatic lesions and coarse nodular cirrhotic liver without portal hypertension.  She underwent liver biopsy which confirmed metastatic breast cancer.  The etiology of liver cirrhosis is not clear-thought to be due to [facility].  No alcohol Negative alpha 1 antitrypsin, ceruloplasmin, ASMA, ANA, normal PT/INR, negative iron studies for hemochromatosis. AFP was borderline elevated at 6.3.  Elevated alk phos-expected  Recent GI workup MRI liver with and without contrast 05/2023 1. Coarse, nodular cirrhotic contour of the liver.  2. Very heterogeneous parenchymal contrast enhancement pattern, with  a background of underlying irregular, reticular T2 hyperintensity  throughout. On portal venous phase of contrast, there are some  particularly conspicuous focally hypoenhancing lesions, generally  with a background of innumerable similar, smaller lesions.  3. Generally suspect that this appearance indeed reflects some  combination of chronic liver disease, fibrosis and diffuse  metastatic disease despite lack of abnormal FDG avidity on prior  PET-CT. Consider tissue sampling to definitively establish the  presence of liver metastatic disease if significant for treatment  decisions.  4. Diffuse, heterogeneously enhancing osseous metastatic disease  throughout the included vertebral bodies.  5. Gallbladder sludge without discrete gallstones.   PET 04/13/2023 1. No signs of tracer avid metastatic  disease.  2. Innumerable small, predominantly sclerotic lesions are identified  throughout the visualized axial and appendicular skeleton. These are  favored to represent treated bone metastases.  3. No abnormal foci of increased uptake within the liver, above  background liver activity to suggest tracer avid liver metastases.  4. Nodular contours of the liver suggestive of cirrhosis.  5.  Aortic Atherosclerosis (ICD10-I70.0).    Liver biopsy 06/21/2023: Consistent with metastatic breast CA  Colonoscopy 04/2018 -Small colonic polyp s/p polypectomy. Bx: Hyperplastic -Small internal hemorrhoids. -rpt 10 yrs- no need to rpt d/t age   EGD 04/2018 Mild gastritis Otherwise normal EGD.  Past Medical History:  Diagnosis Date   Breast cancer (HCC)    Essential hypertension 09/07/2017   History of kidney stones    approx. 2008-- passed   History of sepsis    03-30-2017  urosepsis due to stone obstruction   Hyperlipidemia 09/07/2017   Hypertension    Left ureteral stone    Wears glasses     Past Surgical History:  Procedure Laterality Date   CYSTOSCOPY WITH STENT PLACEMENT Left 03/30/2017   Procedure: CYSTOSCOPY WITH STENT PLACEMENT AND RETROGRADE;  Surgeon: Jerilee Field, MD;  Location: [facility];  Service: Urology;  Laterality: Left;   CYSTOSCOPY/URETEROSCOPY/HOLMIUM LASER/STENT PLACEMENT Left 04/19/2017   Procedure: CYSTOSCOPY LEFT URETEROSCOPY/STENT EXCHANGE;  Surgeon: Jerilee Field, MD;  Location: [facility];  Service: Urology;  Laterality: Left;   MASTECTOMY Right 2022   PARTIAL HYSTERECTOMY  1990s   approach unknown (vaginal? or abdominal?)   TRANSTHORACIC ECHOCARDIOGRAM  04/01/2017   ef 55-60%,  grade 1 diastolic dysfunction/  trivial TR    Family History  Problem Relation Age of Onset   Colon cancer  Neg Hx    Esophageal cancer Neg Hx    Stomach cancer Neg Hx     Social History   Tobacco Use   Smoking status: Never   Smokeless tobacco: Never   Vaping Use   Vaping status: Never Used  Substance Use Topics   Alcohol use: No   Drug use: No    Current Outpatient Medications  Medication Sig Dispense Refill   amLODipine (NORVASC) 5 MG tablet Take 5 mg by mouth every morning.      B Complex-Folic Acid (B COMPLEX VITAMINS, W/ FA,) CAPS Take 1 capsule by mouth daily.     Cholecalciferol (VITAMIN D3) 5000 units TABS Take 5,000 Units by mouth daily.     Fulvestrant (FASLODEX IM) Inject into the muscle every 30 (thirty) days.     omeprazole (PRILOSEC) 20 MG capsule Take 1 capsule (20 mg total) by mouth daily. (Patient taking differently: Take 20 mg by mouth daily as needed.) 30 capsule 0   palbociclib (IBRANCE) 125 MG capsule Take 125 mg by mouth daily with breakfast. Take whole with food. Take for 21 days on, 7 days off, repeat every 28 days.     No current facility-administered medications for this visit.    No Known Allergies  Review of Systems:  neg     Physical Exam:    BP 118/74   Pulse 78   Ht 5\' 4"  (1.626 m)   Wt 122 lb 3.2 oz (55.4 kg)   SpO2 99%   BMI 20.98 kg/m  Wt Readings from Last 3 Encounters:  08/23/23 122 lb 3.2 oz (55.4 kg)  06/21/23 126 lb 1.7 oz (57.2 kg)  06/09/23 126 lb 3.2 oz (57.2 kg)   Constitutional:  Well-developed, in no acute distress. Psychiatric: Normal mood and affect. Behavior is normal. HEENT: Pupils normal.  Conjunctivae are normal. No scleral icterus. Cardiovascular: Normal rate, regular rhythm. No edema Pulmonary/chest: Effort normal and breath sounds normal. No wheezing, rales or rhonchi. Abdominal: Soft, nondistended. Nontender. Bowel sounds active throughout. There are no masses palpable. No hepatomegaly. Rectal: Deferred Neurological: Alert and oriented to person place and time. Skin: Skin is warm and dry. No rashes noted.  Data Reviewed: I have personally reviewed following labs and imaging studies  CBC:    Latest Ref Rng & Units 06/21/2023   12:20 PM 06/09/2023   10:59 AM  12/10/2020   11:01 PM  CBC  WBC 4.0 - 10.5 K/uL 4.3  3.2  6.6   Hemoglobin 12.0 - 15.0 g/dL 16.1  09.6  04.5   Hematocrit 36.0 - 46.0 % 43.2  41.5  42.8   Platelets 150 - 400 K/uL 201  142.0  219     CMP:    Latest Ref Rng & Units 06/09/2023   10:59 AM 12/10/2020   11:01 PM 10/28/2017    2:42 PM  CMP  Glucose 70 - 99 mg/dL 409  811  914   BUN 6 - 23 mg/dL 13  9  13    Creatinine 0.40 - 1.20 mg/dL 7.82  9.56  2.13   Sodium 135 - 145 mEq/L 140  144  141   Potassium 3.5 - 5.1 mEq/L 4.2  3.6  3.6   Chloride 96 - 112 mEq/L 106  109  107   CO2 19 - 32 mEq/L 26  23  25    Calcium 8.4 - 10.5 mg/dL 9.5  9.5  9.4   Total Protein 6.0 - 8.3 g/dL 8.2   8.2  Total Bilirubin 0.2 - 1.2 mg/dL 1.1   1.9   Alkaline Phos 39 - 117 U/L 215   65   AST 0 - 37 U/L 83   31   ALT 0 - 35 U/L 50   29       Edman Circle, MD 08/23/2023, 9:21 AM  Cc: Galvin Proffer, MD

## 2023-08-23 NOTE — Patient Instructions (Addendum)
_______________________________________________________  If your blood pressure at your visit was 140/90 or greater, please contact your primary care physician to follow up on this.  _______________________________________________________  If you are age 70 or older, your body mass index should be between 23-30. Your Body mass index is 20.98 kg/m. If this is out of the aforementioned range listed, please consider follow up with your Primary Care Provider.  If you are age 27 or younger, your body mass index should be between 19-25. Your Body mass index is 20.98 kg/m. If this is out of the aformentioned range listed, please consider follow up with your Primary Care Provider.   ________________________________________________________  The [place] [facility] providers would like to encourage you to use [facility] to communicate with providers for non-urgent requests or questions.  Due to long hold times on the telephone, sending your provider a message by [facility] may be a faster and more efficient way to get a response.  Please allow 48 business hours for a response.  Please remember that this is for non-urgent requests.  _______________________________________________________  Wendy Walsh have been scheduled for an appointment with Dr. Chales Abrahams on 11-17-2023 at 9:30am . Please arrive 10 minutes early for your appointment.  Thank you,  Dr. Lynann Bologna

## 2023-11-17 ENCOUNTER — Ambulatory Visit: Payer: 59 | Admitting: Gastroenterology

## 2023-11-17 ENCOUNTER — Encounter: Payer: Self-pay | Admitting: Gastroenterology

## 2023-11-17 VITALS — BP 110/68 | HR 76

## 2023-11-17 DIAGNOSIS — K7460 Unspecified cirrhosis of liver: Secondary | ICD-10-CM

## 2023-11-17 DIAGNOSIS — C7951 Secondary malignant neoplasm of bone: Secondary | ICD-10-CM | POA: Diagnosis not present

## 2023-11-17 DIAGNOSIS — C787 Secondary malignant neoplasm of liver and intrahepatic bile duct: Secondary | ICD-10-CM

## 2023-11-17 DIAGNOSIS — C50919 Malignant neoplasm of unspecified site of unspecified female breast: Secondary | ICD-10-CM | POA: Diagnosis not present

## 2023-11-17 DIAGNOSIS — R180 Malignant ascites: Secondary | ICD-10-CM

## 2023-11-17 NOTE — Progress Notes (Signed)
Chief Complaint: FU  Referring Provider:  Galvin Proffer, MD      ASSESSMENT AND PLAN;   #1. 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  #2. 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    Plan: -Repeat paracenteses as needed.  Low-salt diet. -FU in 3 months. -She is being closely followed by oncology. -Trend LFTs.   HPI:    Wendy Walsh is a 73 y.o. female  With metastatic breast cancer (being followed by Dr. Welton Flakes (oncology) FU  Unfortunately has new ascites- s/p recent 2 L paracenteses Nov 04, 2023-cytology: Suspicious malignant cells consistent with metastatic breast CA. I do not see any other tests on the fluid.  No GI complaints.  Does not feel her abdomen is more distended.  Felt better after paracentesis.  PET CT 04/2019/MRI Abdo 05/2023 was consistent with several liver metastatic lesions and coarse nodular cirrhotic liver without portal hypertension.  She underwent liver biopsy which confirmed metastatic breast cancer.  The etiology of liver cirrhosis is not clear-thought to be due to [facility].  No alcohol Negative alpha 1 antitrypsin, ceruloplasmin, ASMA, ANA, normal PT/INR, negative iron studies for hemochromatosis. AFP was borderline elevated at 6.3.  Elevated alk phos-expected  Recent GI workup:  MRI with and without contrast 11/03/2023 1. Assessment of the liver is degraded by motion artifact on delayed  postcontrast imaging. As on the previous MRI, there are innumerable  small hypoenhancing nodules scattered throughout the liver  parenchyma. 1 of these in the subcapsular lateral right liver  appears progressive in the interval. While findings remain  nonspecific given the advanced background cirrhosis, metastatic  disease is not excluded.  2. Diffuse heterogeneous marrow enhancement in the visualized  thoracolumbar spine is compatible with metastatic disease.  3. Moderate volume ascites.  4. Trace bilateral pleural effusions.  5.  Cholelithiasis.   MRI with and without contrast MRI liver with and without contrast 05/2023 1. Coarse, nodular cirrhotic contour of the liver.  2. Very heterogeneous parenchymal contrast enhancement pattern, with  a background of underlying irregular, reticular T2 hyperintensity  throughout. On portal venous phase of contrast, there are some  particularly conspicuous focally hypoenhancing lesions, generally  with a background of innumerable similar, smaller lesions.  3. Generally suspect that this appearance indeed reflects some  combination of chronic liver disease, fibrosis and diffuse  metastatic disease despite lack of abnormal FDG avidity on prior  PET-CT. Consider tissue sampling to definitively establish the  presence of liver metastatic disease if significant for treatment  decisions.  4. Diffuse, heterogeneously enhancing osseous metastatic disease  throughout the included vertebral bodies.  5. Gallbladder sludge without discrete gallstones.   PET 04/13/2023 1. No signs of tracer avid metastatic disease.  2. Innumerable small, predominantly sclerotic lesions are identified  throughout the visualized axial and appendicular skeleton. These are  favored to represent treated bone metastases.  3. No abnormal foci of increased uptake within the liver, above  background liver activity to suggest tracer avid liver metastases.  4. Nodular contours of the liver suggestive of cirrhosis.  5.  Aortic Atherosclerosis (ICD10-I70.0).    Liver biopsy 06/21/2023: Consistent with metastatic breast CA  Colonoscopy 04/2018 -Small colonic polyp s/p polypectomy. Bx: Hyperplastic -Small internal hemorrhoids. -rpt 10 yrs- no need to rpt d/t age   EGD 04/2018 Mild gastritis Otherwise normal EGD.  Past Medical History:  Diagnosis Date   Breast cancer [facility])    Essential hypertension 09/07/2017   History of kidney stones  approx. 2008-- passed   History of sepsis    03-30-2017  urosepsis  due to stone obstruction   Hyperlipidemia 09/07/2017   Hypertension    Left ureteral stone    Wears glasses     Past Surgical History:  Procedure Laterality Date   CYSTOSCOPY WITH STENT PLACEMENT Left 03/30/2017   Procedure: CYSTOSCOPY WITH STENT PLACEMENT AND RETROGRADE;  Surgeon: Jerilee Field, MD;  Location: [facility];  Service: Urology;  Laterality: Left;   CYSTOSCOPY/URETEROSCOPY/HOLMIUM LASER/STENT PLACEMENT Left 04/19/2017   Procedure: CYSTOSCOPY LEFT URETEROSCOPY/STENT EXCHANGE;  Surgeon: Jerilee Field, MD;  Location: [facility];  Service: Urology;  Laterality: Left;   MASTECTOMY Right 2022   PARTIAL HYSTERECTOMY  1990s   approach unknown (vaginal? or abdominal?)   TRANSTHORACIC ECHOCARDIOGRAM  04/01/2017   ef 55-60%,  grade 1 diastolic dysfunction/  trivial TR    Family History  Problem Relation Age of Onset   Colon cancer Neg Hx    Esophageal cancer Neg Hx    Stomach cancer Neg Hx     Social History   Tobacco Use   Smoking status: Never   Smokeless tobacco: Never  Vaping Use   Vaping status: Never Used  Substance Use Topics   Alcohol use: No   Drug use: No    Current Outpatient Medications  Medication Sig Dispense Refill   amLODipine (NORVASC) 5 MG tablet Take 5 mg by mouth every morning.      B Complex-Folic Acid (B COMPLEX VITAMINS, W/ FA,) CAPS Take 1 capsule by mouth daily.     Cholecalciferol (VITAMIN D3) 5000 units TABS Take 5,000 Units by mouth daily.     fam-trastuzumab deruxtecan-nxki (ENHERTU) 100 MG SOLR Inject into the vein once.     Fulvestrant (FASLODEX IM) Inject into the muscle every 30 (thirty) days.     omeprazole (PRILOSEC) 20 MG capsule Take 1 capsule (20 mg total) by mouth daily. (Patient taking differently: Take 20 mg by mouth daily as needed.) 30 capsule 0   No current facility-administered medications for this visit.    No Known Allergies  Review of Systems:  neg     Physical Exam:    BP 110/68   Pulse  76  Wt Readings from Last 3 Encounters:  08/23/23 122 lb 3.2 oz (55.4 kg)  06/21/23 126 lb 1.7 oz (57.2 kg)  06/09/23 126 lb 3.2 oz (57.2 kg)   Constitutional:  Well-developed, in no acute distress. Psychiatric: Normal mood and affect. Behavior is normal. HEENT: Pupils normal.  Conjunctivae are normal. No scleral icterus. Cardiovascular: Normal rate, regular rhythm. No edema Pulmonary/chest: Effort normal and breath sounds normal. No wheezing, rales or rhonchi. Abdominal: Soft, nondistended at this time. Nontender. Bowel sounds active throughout. There are no masses palpable. No hepatomegaly. Rectal: Deferred Neurological: Alert and oriented to person place and time. Skin: Skin is warm and dry. No rashes noted.  Data Reviewed: I have personally reviewed following labs and imaging studies  CBC:    Latest Ref Rng & Units 06/21/2023   12:20 PM 06/09/2023   10:59 AM 12/10/2020   11:01 PM  CBC  WBC 4.0 - 10.5 K/uL 4.3  3.2  6.6   Hemoglobin 12.0 - 15.0 g/dL 40.9  81.1  91.4   Hematocrit 36.0 - 46.0 % 43.2  41.5  42.8   Platelets 150 - 400 K/uL 201  142.0  219     CMP:    Latest Ref Rng & Units 06/09/2023   10:59 AM  12/10/2020   11:01 PM 10/28/2017    2:42 PM  CMP  Glucose 70 - 99 mg/dL 536  644  034   BUN 6 - 23 mg/dL 13  9  13    Creatinine 0.40 - 1.20 mg/dL 7.42  5.95  6.38   Sodium 135 - 145 mEq/L 140  144  141   Potassium 3.5 - 5.1 mEq/L 4.2  3.6  3.6   Chloride 96 - 112 mEq/L 106  109  107   CO2 19 - 32 mEq/L 26  23  25    Calcium 8.4 - 10.5 mg/dL 9.5  9.5  9.4   Total Protein 6.0 - 8.3 g/dL 8.2   8.2   Total Bilirubin 0.2 - 1.2 mg/dL 1.1   1.9   Alkaline Phos 39 - 117 U/L 215   65   AST 0 - 37 U/L 83   31   ALT 0 - 35 U/L 50   29       Edman Circle, MD 11/17/2023, 9:58 AM  Cc: Galvin Proffer, MD

## 2023-11-17 NOTE — Patient Instructions (Signed)
_______________________________________________________  If your blood pressure at your visit was 140/90 or greater, please contact your primary care physician to follow up on this.  _______________________________________________________  If you are age 71 or older, your body mass index should be between 23-30. Your There is no height or weight on file to calculate BMI. If this is out of the aforementioned range listed, please consider follow up with your Primary Care Provider.  If you are age 20 or younger, your body mass index should be between 19-25. Your There is no height or weight on file to calculate BMI. If this is out of the aformentioned range listed, please consider follow up with your Primary Care Provider.   ________________________________________________________  The [place] [facility] providers would like to encourage you to use [facility] to communicate with providers for non-urgent requests or questions.  Due to long hold times on the telephone, sending your provider a message by [facility] may be a faster and more efficient way to get a response.  Please allow 48 business hours for a response.  Please remember that this is for non-urgent requests.  _______________________________________________________  Please follow up in 3 months. Give [place] a call at [phone] to schedule an appointment. Call in February to see about an April appointment  Thank you,  Dr. Lynann Bologna

## 2023-11-23 ENCOUNTER — Encounter: Payer: Self-pay | Admitting: Gastroenterology

## 2023-11-27 NOTE — Telephone Encounter (Signed)
MRCP without CBD stones.  That is a good sign. Can you please work her into APP clinic or mine in 1 to 2 weeks RG

## 2023-11-29 NOTE — Telephone Encounter (Signed)
Pt daughter Maayan Jenning was notified of Dr. Chales Abrahams recommendations: Pt to be scheduled to see Dr. Chales Abrahams on 12/14/2023 at 2:30 PM.  (7 day hold)  Elvera Lennox made aware.  Reminder placed for 12/06/2022 to schedule pt. Elvera Lennox  verbalized understanding with all questions answered.

## 2023-12-07 ENCOUNTER — Telehealth: Payer: Self-pay

## 2023-12-07 NOTE — Telephone Encounter (Signed)
Pt daughter Tausha Milhoan was notified of the confirmed appointment on 12/14/2023 at 2:30 to see Dr. Chales Abrahams. Elvera Lennox  verbalized understanding with all questions answered.

## 2023-12-07 NOTE — Telephone Encounter (Signed)
-----   Message from Nurse Elspeth RAMAN sent at 11/29/2023  2:42 PM EST ----- Pt daughter Zayli Villafuerte was notified of Dr. Charlanne recommendations: Pt to be scheduled to see Dr. Charlanne on 12/14/2023 at 2:30 PM.  (7 day hold)  Otha made aware.  Reminder placed for 12/06/2022 to schedule pt. Otha  verbalized understanding with all questions answered.

## 2023-12-14 ENCOUNTER — Encounter: Payer: Self-pay | Admitting: Gastroenterology

## 2023-12-14 ENCOUNTER — Ambulatory Visit (INDEPENDENT_AMBULATORY_CARE_PROVIDER_SITE_OTHER): Payer: 59 | Admitting: Gastroenterology

## 2023-12-14 VITALS — BP 100/70 | HR 104 | Ht 64.0 in | Wt 116.4 lb

## 2023-12-14 DIAGNOSIS — C50919 Malignant neoplasm of unspecified site of unspecified female breast: Secondary | ICD-10-CM

## 2023-12-14 DIAGNOSIS — C787 Secondary malignant neoplasm of liver and intrahepatic bile duct: Secondary | ICD-10-CM | POA: Diagnosis not present

## 2023-12-14 DIAGNOSIS — K7460 Unspecified cirrhosis of liver: Secondary | ICD-10-CM

## 2023-12-14 DIAGNOSIS — K766 Portal hypertension: Secondary | ICD-10-CM | POA: Diagnosis not present

## 2023-12-14 MED ORDER — SPIRONOLACTONE 50 MG PO TABS: 50.0000 mg | ORAL_TABLET | Freq: Every day | ORAL | 3 refills | Status: AC

## 2023-12-14 MED ORDER — FUROSEMIDE 20 MG PO TABS: 20.0000 mg | ORAL_TABLET | Freq: Every day | ORAL | 3 refills | Status: AC

## 2023-12-14 NOTE — Patient Instructions (Addendum)
_______________________________________________________  If your blood pressure at your visit was 140/90 or greater, please contact your primary care physician to follow up on this.  _______________________________________________________  If you are age 71 or older, your body mass index should be between 23-30. Your Body mass index is 19.98 kg/m. If this is out of the aforementioned range listed, please consider follow up with your Primary Care Provider.  If you are age 47 or younger, your body mass index should be between 19-25. Your Body mass index is 19.98 kg/m. If this is out of the aformentioned range listed, please consider follow up with your Primary Care Provider.   ________________________________________________________  The [place] [facility] providers would like to encourage you to use [facility] to communicate with providers for non-urgent requests or questions.  Due to long hold times on the telephone, sending your provider a message by [facility] may be a faster and more efficient way to get a response.  Please allow 48 business hours for a response.  Please remember that this is for non-urgent requests.  _______________________________________________________  We have sent the following medications to your pharmacy for you to pick up at your convenience: Lasix 20mg  daily and aldactone 50mg  daily  You have been scheduled for an appointment with Dr. Chales Abrahams on 01-06-24 at 11am . Please arrive 10 minutes early for your appointment.  Continue a salt diet.  You have been given order forms for you to take to your paracentesis appointment to have drawn.  Low-Sodium Eating Plan Salt (sodium) helps you keep a healthy balance of fluids in your body. Too much sodium can raise your blood pressure. It can also cause fluid and waste to be held in your body. Your health care provider or dietitian may recommend a low-sodium eating plan if you have high blood pressure (hypertension), kidney disease, liver  disease, or heart failure. Eating less sodium can help lower your blood pressure and reduce swelling. It can also protect your heart, liver, and kidneys. What are tips for following this plan? Reading food labels  Check food labels for the amount of sodium per serving. If you eat more than one serving, you must multiply the listed amount by the number of servings. Choose foods with less than 140 milligrams (mg) of sodium per serving. Avoid foods with 300 mg of sodium or more per serving. Always check how much sodium is in a product, even if the label says "unsalted" or "no salt added." Shopping  Buy products labeled as "low-sodium" or "no salt added." Buy fresh foods. Avoid canned foods and pre-made or frozen meals. Avoid canned, cured, or processed meats. Buy breads that have less than 80 mg of sodium per slice. Cooking  Eat more home-cooked food. Try to eat less restaurant, buffet, and fast food. Try not to add salt when you cook. Use salt-free seasonings or herbs instead of table salt or sea salt. Check with your provider or pharmacist before using salt substitutes. Cook with plant-based oils, such as canola, sunflower, or olive oil. Meal planning When eating at a restaurant, ask if your food can be made with less salt or no salt. Avoid dishes labeled as brined, pickled, cured, or smoked. Avoid dishes made with soy sauce, miso, or teriyaki sauce. Avoid foods that have monosodium glutamate (MSG) in them. MSG may be added to some restaurant food, sauces, soups, bouillon, and canned foods. Make meals that can be grilled, baked, poached, roasted, or steamed. These are often made with less sodium. General information Try to  limit your sodium intake to 1,500-2,300 mg each day, or the amount told by your provider. What foods should I eat? Fruits Fresh, frozen, or canned fruit. Fruit juice. Vegetables Fresh or frozen vegetables. "No salt added" canned vegetables. "No salt added" tomato sauce  and paste. Low-sodium or reduced-sodium tomato and vegetable juice. Grains Low-sodium cereals, such as oats, puffed wheat and rice, and shredded wheat. Low-sodium crackers. Unsalted rice. Unsalted pasta. Low-sodium bread. Whole grain breads and whole grain pasta. Meats and other proteins Fresh or frozen meat, poultry, seafood, and fish. These should have no added salt. Low-sodium canned tuna and salmon. Unsalted nuts. Dried peas, beans, and lentils without added salt. Unsalted canned beans. Eggs. Unsalted nut butters. Dairy Milk. Soy milk. Cheese that is naturally low in sodium, such as ricotta cheese, fresh mozzarella, or Swiss cheese. Low-sodium or reduced-sodium cheese. Cream cheese. Yogurt. Seasonings and condiments Fresh and dried herbs and spices. Salt-free seasonings. Low-sodium mustard and ketchup. Sodium-free salad dressing. Sodium-free light mayonnaise. Fresh or refrigerated horseradish. Lemon juice. Vinegar. Other foods Homemade, reduced-sodium, or low-sodium soups. Unsalted popcorn and pretzels. Low-salt or salt-free chips. The items listed above may not be all the foods and drinks you can have. Talk to a dietitian to learn more. What foods should I avoid? Vegetables Sauerkraut, pickled vegetables, and relishes. Olives. [place] fries. Onion rings. Regular canned vegetables, except low-sodium or reduced-sodium items. Regular canned tomato sauce and paste. Regular tomato and vegetable juice. Frozen vegetables in sauces. Grains Instant hot cereals. Bread stuffing, pancake, and biscuit mixes. Croutons. Seasoned rice or pasta mixes. Noodle soup cups. Boxed or frozen macaroni and cheese. Regular salted crackers. Self-rising flour. Meats and other proteins Meat or fish that is salted, canned, smoked, spiced, or pickled. Precooked or cured meat, such as sausages or meat loaves. Tomasa Blase. Ham. Pepperoni. Hot dogs. Corned beef. Chipped beef. Salt pork. Jerky. Pickled herring, anchovies, and sardines.  Regular canned tuna. Salted nuts. Dairy Processed cheese and cheese spreads. Hard cheeses. Cheese curds. Blue cheese. Feta cheese. String cheese. Regular cottage cheese. Buttermilk. Canned milk. Fats and oils Salted butter. Regular margarine. Ghee. Bacon fat. Seasonings and condiments Onion salt, garlic salt, seasoned salt, table salt, and sea salt. Canned and packaged gravies. Worcestershire sauce. Tartar sauce. Barbecue sauce. Teriyaki sauce. Soy sauce, including reduced-sodium soy sauce. Steak sauce. Fish sauce. Oyster sauce. Cocktail sauce. Horseradish that you find on the shelf. Regular ketchup and mustard. Meat flavorings and tenderizers. Bouillon cubes. Hot sauce. Pre-made or packaged marinades. Pre-made or packaged taco seasonings. Relishes. Regular salad dressings. Salsa. Other foods Salted popcorn and pretzels. Corn chips and puffs. Potato and tortilla chips. Canned or dried soups. Pizza. Frozen entrees and pot pies. The items listed above may not be all the foods and drinks you should avoid. Talk to a dietitian to learn more. This information is not intended to replace advice given to you by your health care provider. Make sure you discuss any questions you have with your health care provider. Document Revised: 11/04/2022 Document Reviewed: 11/04/2022 Elsevier Patient Education  2024 Elsevier Inc.  Thank you,  Dr. Lynann Bologna

## 2023-12-14 NOTE — Progress Notes (Unsigned)
Chief Complaint: FU  Referring Provider:  Galvin Proffer, MD      ASSESSMENT AND PLAN;   #1. 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  #2. Underlying liver cirrhosis with pHTN.    Plan: -Lasix 20mg  po every day/aldactone 50mg  po every day #90, 3RF -Low salt diet -At next paracentesis, check ascitic alb, TP, cytology and cell count -CBC, CMP, INR (Friday) -FU in 4 weeks. -She is being closely followed by oncology.   HPI:    Wendy Walsh is a 73 y.o. female  With metastatic breast cancer (being followed by Dr. Welton Flakes (oncology) FU  Had GS pancrattis  Wt Readings from Last 3 Encounters:  12/14/23 116 lb 6 oz (52.8 kg)  08/23/23 122 lb 3.2 oz (55.4 kg)  06/21/23 126 lb 1.7 oz (57.2 kg)     Unfortunately has new ascites- s/p recent 2 L paracenteses Nov 04, 2023-cytology: Suspicious malignant cells consistent with metastatic breast CA. I do not see any other tests on the fluid.  No GI complaints.  Does not feel her abdomen is more distended.  Felt better after paracentesis.  PET CT 04/2019/MRI [place] 05/2023 was consistent with several liver metastatic lesions and coarse nodular cirrhotic liver without portal hypertension.  She underwent liver biopsy which confirmed metastatic breast cancer.  The etiology of liver cirrhosis is not clear-thought to be due to [facility].  No alcohol Negative alpha 1 antitrypsin, ceruloplasmin, ASMA, ANA, normal PT/INR, negative iron studies for hemochromatosis. AFP was borderline elevated at 6.3.  Elevated alk phos-expected  Recent GI workup:  MRI with and without contrast 11/03/2023 1. Assessment of the liver is degraded by motion artifact on delayed  postcontrast imaging. As on the previous MRI, there are innumerable  small hypoenhancing nodules scattered throughout the liver  parenchyma. 1 of these in the subcapsular lateral right liver  appears progressive in the interval. While findings remain  nonspecific  given the advanced background cirrhosis, metastatic  disease is not excluded.  2. Diffuse heterogeneous marrow enhancement in the visualized  thoracolumbar spine is compatible with metastatic disease.  3. Moderate volume ascites.  4. Trace bilateral pleural effusions.  5. Cholelithiasis.   MRI with and without contrast MRI liver with and without contrast 05/2023 1. Coarse, nodular cirrhotic contour of the liver.  2. Very heterogeneous parenchymal contrast enhancement pattern, with  a background of underlying irregular, reticular T2 hyperintensity  throughout. On portal venous phase of contrast, there are some  particularly conspicuous focally hypoenhancing lesions, generally  with a background of innumerable similar, smaller lesions.  3. Generally suspect that this appearance indeed reflects some  combination of chronic liver disease, fibrosis and diffuse  metastatic disease despite lack of abnormal FDG avidity on prior  PET-CT. Consider tissue sampling to definitively establish the  presence of liver metastatic disease if significant for treatment  decisions.  4. Diffuse, heterogeneously enhancing osseous metastatic disease  throughout the included vertebral bodies.  5. Gallbladder sludge without discrete gallstones.   PET 04/13/2023 1. No signs of tracer avid metastatic disease.  2. Innumerable small, predominantly sclerotic lesions are identified  throughout the visualized axial and appendicular skeleton. These are  favored to represent treated bone metastases.  3. No abnormal foci of increased uptake within the liver, above  background liver activity to suggest tracer avid liver metastases.  4. Nodular contours of the liver suggestive of cirrhosis.  5.  Aortic Atherosclerosis (ICD10-I70.0).    Liver biopsy 06/21/2023: Consistent with metastatic  breast CA  Colonoscopy 04/2018 -Small colonic polyp s/p polypectomy. Bx: Hyperplastic -Small internal hemorrhoids. -rpt 10 yrs- no  need to rpt d/t age   EGD 04/2018 Mild gastritis Otherwise normal EGD.  Past Medical History:  Diagnosis Date   Breast cancer (HCC)    Essential hypertension 09/07/2017   History of kidney stones    approx. 2008-- passed   History of sepsis    03-30-2017  urosepsis due to stone obstruction   Hyperlipidemia 09/07/2017   Hypertension    Left ureteral stone    Wears glasses     Past Surgical History:  Procedure Laterality Date   CYSTOSCOPY WITH STENT PLACEMENT Left 03/30/2017   Procedure: CYSTOSCOPY WITH STENT PLACEMENT AND RETROGRADE;  Surgeon: Jerilee Field, MD;  Location: [facility];  Service: Urology;  Laterality: Left;   CYSTOSCOPY/URETEROSCOPY/HOLMIUM LASER/STENT PLACEMENT Left 04/19/2017   Procedure: CYSTOSCOPY LEFT URETEROSCOPY/STENT EXCHANGE;  Surgeon: Jerilee Field, MD;  Location: [facility];  Service: Urology;  Laterality: Left;   MASTECTOMY Right 2022   PARTIAL HYSTERECTOMY  1990s   approach unknown (vaginal? or abdominal?)   TRANSTHORACIC ECHOCARDIOGRAM  04/01/2017   ef 55-60%,  grade 1 diastolic dysfunction/  trivial TR    Family History  Problem Relation Age of Onset   Colon cancer Neg Hx    Esophageal cancer Neg Hx    Stomach cancer Neg Hx     Social History   Tobacco Use   Smoking status: Never   Smokeless tobacco: Never  Vaping Use   Vaping status: Never Used  Substance Use Topics   Alcohol use: No   Drug use: No    Current Outpatient Medications  Medication Sig Dispense Refill   B Complex-Folic Acid (B COMPLEX VITAMINS, W/ FA,) CAPS Take 1 capsule by mouth daily.     Cholecalciferol (VITAMIN D3) 5000 units TABS Take 5,000 Units by mouth daily.     fam-trastuzumab deruxtecan-nxki (ENHERTU) 100 MG SOLR Inject into the vein once.     Fulvestrant (FASLODEX IM) Inject into the muscle every 30 (thirty) days.     omeprazole (PRILOSEC) 20 MG capsule Take 1 capsule (20 mg total) by mouth daily. (Patient taking differently: Take 20 mg  by mouth daily as needed.) 30 capsule 0   ondansetron (ZOFRAN-ODT) 8 MG disintegrating tablet Dissolve 1 tablet (8 mg total) by mouth every 8 hours as needed (chemo induced nausea/vomiting)     oxyCODONE (OXY IR/ROXICODONE) 5 MG immediate release tablet Take 5 mg by mouth every 6 (six) hours as needed.     prochlorperazine (COMPAZINE) 10 MG tablet Take 1 tablet (10 mg total) by mouth every 6 (six) hours as needed (chemo induced nausea/vomiting)     spironolactone (ALDACTONE) 25 MG tablet Take 25 mg by mouth 2 (two) times daily.     No current facility-administered medications for this visit.    No Known Allergies  Review of Systems:  neg     Physical Exam:    BP 100/70 (BP Location: Left Arm, Patient Position: Sitting, Cuff Size: Normal)   Pulse (!) 104   Ht 5\' 4"  (1.626 m)   Wt 116 lb 6 oz (52.8 kg)   BMI 19.98 kg/m  Wt Readings from Last 3 Encounters:  12/14/23 116 lb 6 oz (52.8 kg)  08/23/23 122 lb 3.2 oz (55.4 kg)  06/21/23 126 lb 1.7 oz (57.2 kg)   Constitutional:  Well-developed, in no acute distress. Psychiatric: Normal mood and affect. Behavior is normal. HEENT: Pupils normal.  Conjunctivae are normal. No scleral icterus. Cardiovascular: Normal rate, regular rhythm. No edema Pulmonary/chest: Effort normal and breath sounds normal. No wheezing, rales or rhonchi. Abdominal: Soft, nondistended at this time. Nontender. Bowel sounds active throughout. There are no masses palpable. No hepatomegaly. Rectal: Deferred Neurological: Alert and oriented to person place and time. Skin: Skin is warm and dry. No rashes noted.  Data Reviewed: I have personally reviewed following labs and imaging studies  CBC:    Latest Ref Rng & Units 06/21/2023   12:20 PM 06/09/2023   10:59 AM 12/10/2020   11:01 PM  CBC  WBC 4.0 - 10.5 K/uL 4.3  3.2  6.6   Hemoglobin 12.0 - 15.0 g/dL 08.6  57.8  46.9   Hematocrit 36.0 - 46.0 % 43.2  41.5  42.8   Platelets 150 - 400 K/uL 201  142.0  219      CMP:    Latest Ref Rng & Units 06/09/2023   10:59 AM 12/10/2020   11:01 PM 10/28/2017    2:42 PM  CMP  Glucose 70 - 99 mg/dL 629  528  413   BUN 6 - 23 mg/dL 13  9  13    Creatinine 0.40 - 1.20 mg/dL 2.44  0.10  2.72   Sodium 135 - 145 mEq/L 140  144  141   Potassium 3.5 - 5.1 mEq/L 4.2  3.6  3.6   Chloride 96 - 112 mEq/L 106  109  107   CO2 19 - 32 mEq/L 26  23  25    Calcium 8.4 - 10.5 mg/dL 9.5  9.5  9.4   Total Protein 6.0 - 8.3 g/dL 8.2   8.2   Total Bilirubin 0.2 - 1.2 mg/dL 1.1   1.9   Alkaline Phos 39 - 117 U/L 215   65   AST 0 - 37 U/L 83   31   ALT 0 - 35 U/L 50   29       Edman Circle, MD 12/14/2023, 2:28 PM  Cc: Galvin Proffer, MD

## 2023-12-15 ENCOUNTER — Telehealth: Payer: Self-pay

## 2023-12-15 NOTE — Telephone Encounter (Addendum)
Spoke with Tresa Endo at Dr Welton Flakes office and they don't draw lab work for the paracentesis and if they did they would write it in the comments but its not in there so she told me to call IR at [phone] and when I called them they told me to call the lab at [phone] fax number [phone] and when I got someone in the lab they told me to fax the lab orders to [phone] and I did successfully. I let patient's daughter know and told her to bring the lab orders we given her in clinic to her appointment as well and she voiced understanding

## 2023-12-19 ENCOUNTER — Encounter: Payer: Self-pay | Admitting: Gastroenterology

## 2024-01-01 ENCOUNTER — Emergency Department (HOSPITAL_BASED_OUTPATIENT_CLINIC_OR_DEPARTMENT_OTHER)
Admission: EM | Admit: 2024-01-01 | Discharge: 2024-01-01 | Disposition: A | Discharge status: Home / Self Care | Attending: Emergency Medicine | Admitting: Emergency Medicine

## 2024-01-01 ENCOUNTER — Other Ambulatory Visit: Payer: Self-pay

## 2024-01-01 ENCOUNTER — Encounter (HOSPITAL_BASED_OUTPATIENT_CLINIC_OR_DEPARTMENT_OTHER): Payer: Self-pay | Admitting: *Deleted

## 2024-01-01 ENCOUNTER — Encounter: Payer: Self-pay | Admitting: Gastroenterology

## 2024-01-01 DIAGNOSIS — K649 Unspecified hemorrhoids: Secondary | ICD-10-CM

## 2024-01-01 DIAGNOSIS — K5900 Constipation, unspecified: Secondary | ICD-10-CM | POA: Diagnosis not present

## 2024-01-01 HISTORY — DX: Unspecified cirrhosis of liver: K74.60

## 2024-01-01 MED ORDER — HYDROCORTISONE ACETATE 25 MG RE SUPP: 25.0000 mg | Freq: Two times a day (BID) | RECTAL | 0 refills | Status: AC

## 2024-01-01 NOTE — ED Notes (Signed)
 ED Provider at bedside.

## 2024-01-01 NOTE — ED Triage Notes (Addendum)
 Pt presents with c/o constipation. Onset 5 days ago. Took Miralax with no relief . Pt had manually removed feces after trying to defecate/ Pt has hemorrhoids that bleeds during these episodes. This has happened before - however never this severe. Cancer PT- of rt breast that has mets to liver. Denies N/V, Occas abdominal pain - denies abd. Pain in rectum - pressure HX : cirrhosis

## 2024-01-01 NOTE — ED Provider Notes (Signed)
 [facility] EM[facility]GENCY DEPARTMENT AT [facility] [place] Provider Note   CSN: [id number] Arrival date & time: 01/01/24  0604     History  Chief Complaint  Patient presents with   Constipation    ( Cancer patient- R breast CA which has mets-liver. pt is under immunosuppressive therapy - received treatment every 3 weeks     Wendy Walsh is a 73 y.o. female.  Patient is a 73 year old female who presents with constipation.  History is obtained through a audio language interpreter as well as the patient's daughter who is at bedside.  Patient presents with constipation it has been going on for the last 5 to 6 days.  She has not had been able to have a bowel movement.  She feels pressure and stool in her rectum but she is unable to get it out.  She denies any abdominal pain.  No nausea or vomiting.  She does have a history of metastatic breast cancer with metastatic disease to the liver and bones.  She has cirrhosis and portal hypertension.  She does not take any opioid medications for pain currently.  She does have a prior history of constipation but it has not gotten this bad in the past.  She has been trying to take some of the stool out on her own.       Home Medications Prior to Admission medications   Medication Sig Start Date End Date Taking? Authorizing Provider  hydrocortisone (ANUSOL-HC) 25 MG suppository Place 1 suppository (25 mg total) rectally 2 (two) times daily. 01/01/24  Yes Rolan Bucco, MD  B Complex-Folic Acid (B COMPLEX VITAMINS, W/ FA,) CAPS Take 1 capsule by mouth daily. 12/08/20   [provider]  Cholecalciferol (VITAMIN D3) 5000 units TABS Take 5,000 Units by mouth daily.    [provider]  fam-trastuzumab deruxtecan-nxki (ENHERTU) 100 MG SOLR Inject into the vein once.    [provider]  Fulvestrant (FASLODEX IM) Inject into the muscle every 30 (thirty) days.    [provider]  furosemide (LASIX) 20 MG tablet Take 1 tablet  (20 mg total) by mouth daily. 12/14/23   Lynann Bologna, MD  omeprazole (PRILOSEC) 20 MG capsule Take 1 capsule (20 mg total) by mouth daily. Patient taking differently: Take 20 mg by mouth daily as needed. 03/17/18   Lynann Bologna, MD  ondansetron (ZOFRAN-ODT) 8 MG disintegrating tablet Dissolve 1 tablet (8 mg total) by mouth every 8 hours as needed (chemo induced nausea/vomiting) 11/01/23   [provider]  oxyCODONE (OXY IR/ROXICODONE) 5 MG immediate release tablet Take 5 mg by mouth every 6 (six) hours as needed. 11/28/23   [provider]  prochlorperazine (COMPAZINE) 10 MG tablet Take 1 tablet (10 mg total) by mouth every 6 (six) hours as needed (chemo induced nausea/vomiting) 11/01/23   [provider]  spironolactone (ALDACTONE) 25 MG tablet Take 25 mg by mouth 2 (two) times daily. 12/05/23 04/03/24  [provider]  spironolactone (ALDACTONE) 50 MG tablet Take 1 tablet (50 mg total) by mouth daily. 12/14/23   Lynann Bologna, MD      Allergies    Patient has no known allergies.    Review of Systems   Review of Systems  Constitutional:  Negative for chills, diaphoresis, fatigue and fever.  HENT:  Negative for congestion, rhinorrhea and sneezing.   Eyes: Negative.   Respiratory:  Negative for cough, chest tightness and shortness of breath.   Cardiovascular:  Negative for chest pain and leg  swelling.  Gastrointestinal:  Positive for constipation. Negative for abdominal pain, blood in stool, diarrhea, nausea and vomiting.  Genitourinary:  Negative for difficulty urinating, flank pain, frequency and hematuria.  Musculoskeletal:  Negative for arthralgias and back pain.  Skin:  Negative for rash.  Neurological:  Negative for dizziness, speech difficulty, weakness, numbness and headaches.    Physical Exam Updated Vital Signs BP 118/65 (BP Location: Left Arm)   Pulse 99   Temp 97.6 F (36.4 C) (Oral)   Resp 15   Ht 5\' 4"  (1.626 m)   Wt 49.9 kg   SpO2 99%    BMI 18.88 kg/m  Physical Exam Constitutional:      Appearance: She is well-developed.  HENT:     Head: Normocephalic and atraumatic.  Eyes:     Pupils: Pupils are equal, round, and reactive to light.  Cardiovascular:     Rate and Rhythm: Normal rate and regular rhythm.     Heart sounds: Normal heart sounds.  Pulmonary:     Effort: Pulmonary effort is normal. No respiratory distress.     Breath sounds: Normal breath sounds. No wheezing or rales.  Chest:     Chest wall: No tenderness.  Abdominal:     General: Bowel sounds are normal.     Palpations: Abdomen is soft.     Tenderness: There is no abdominal tenderness. There is no guarding or rebound.  Genitourinary:    Comments: Rectal exam shows a small nonthrombosed external hemorrhoid.  There is large amount of soft stool in the rectal vault.  Attempted manual disimpaction but patient was not tolerating this very well. Musculoskeletal:        General: Normal range of motion.     Cervical back: Normal range of motion and neck supple.  Lymphadenopathy:     Cervical: No cervical adenopathy.  Skin:    General: Skin is warm and dry.     Findings: No rash.  Neurological:     Mental Status: She is alert and oriented to person, place, and time.     ED Results / Procedures / Treatments   Labs (all labs ordered are listed, but only abnormal results are displayed) Labs Reviewed - No data to display  EKG None  Radiology No results found.  Procedures Procedures    Medications Ordered in ED Medications - No data to display  ED Course/ Medical Decision Making/ A&P                                 Medical Decision Making Problems Addressed: Constipation, unspecified constipation type: acute illness or injury Hemorrhoids, unspecified hemorrhoid type: acute illness or injury  Amount and/or Complexity of Data Reviewed Independent Historian:     Details: Daughter External Data Reviewed: notes.  Risk Prescription drug  management. Decision regarding hospitalization.   Patient is a 73 year old who presents with constipation.  She does not have any associated abdominal pain, vomiting or other symptoms that would be more concerning for other etiologies such as bowel obstruction, colitis, perforation or other acute illnesses.  Given this, labs and imaging was not performed.  I attempted to manually disimpact the patient but she was not tolerating this well.  Soapsuds enema was performed.  She had a large bowel movement following this.  She feels much better.  She has no abdominal pain on reexam.  She was discharged home in good condition.  Will give her some  Anusol suppositories for her reported internal hemorrhoids.  Advised to her and her daughter to use MiraLAX for the next few days and to start taking a diet daily fiber supplement.  She was advised to follow-up with her gastroenterologist if her symptoms are improving.  Return precautions were given.  Final Clinical Impression(s) / ED Diagnoses Final diagnoses:  Constipation, unspecified constipation type  Hemorrhoids, unspecified hemorrhoid type    Rx / DC Orders ED Discharge Orders          Ordered    hydrocortisone (ANUSOL-HC) 25 MG suppository  2 times daily        01/01/24 0854              Rolan Bucco, MD 01/01/24 [phone]

## 2024-01-01 NOTE — ED Notes (Addendum)
 Patient does not speak [place]- Daughter / patient request  translation be done by daughter. Daughter at bedside .

## 2024-01-01 NOTE — ED Notes (Signed)
 Large BM noted after soap suds enema, patient tolerated well.

## 2024-01-01 NOTE — Discharge Instructions (Addendum)
 Take a fiber supplement such as Metamucil daily.  Use MiraLAX for the next few days.  Follow-up with your gastroenterologist if your symptoms are not improving.  Return to the emergency room if you have any worsening symptoms.

## 2024-01-02 NOTE — Telephone Encounter (Signed)
 Spoke with pt daughter Elvera Lennox in regard to my chart message that was sent. Elvera Lennox stated that they went to the ER yesterday and her mom was taken care of there. Elvera Lennox verbalized understanding with all questions answered.

## 2024-01-06 ENCOUNTER — Encounter: Payer: Self-pay | Admitting: Gastroenterology

## 2024-01-06 ENCOUNTER — Ambulatory Visit: Payer: 59 | Admitting: Gastroenterology

## 2024-01-06 VITALS — BP 118/62 | HR 65 | Ht 64.0 in | Wt 113.0 lb

## 2024-01-06 DIAGNOSIS — K7460 Unspecified cirrhosis of liver: Secondary | ICD-10-CM | POA: Diagnosis not present

## 2024-01-06 DIAGNOSIS — C50919 Malignant neoplasm of unspecified site of unspecified female breast: Secondary | ICD-10-CM | POA: Diagnosis not present

## 2024-01-06 DIAGNOSIS — C787 Secondary malignant neoplasm of liver and intrahepatic bile duct: Secondary | ICD-10-CM | POA: Diagnosis not present

## 2024-01-06 DIAGNOSIS — K766 Portal hypertension: Secondary | ICD-10-CM

## 2024-01-06 DIAGNOSIS — K648 Other hemorrhoids: Secondary | ICD-10-CM | POA: Diagnosis not present

## 2024-01-06 MED ORDER — FLUTICASONE PROPIONATE 0.05 % EX CREA: TOPICAL_CREAM | Freq: Two times a day (BID) | CUTANEOUS | 2 refills | Status: AC

## 2024-01-06 NOTE — Patient Instructions (Addendum)
 _______________________________________________________  If your blood pressure at your visit was 140/90 or greater, please contact your primary care physician to follow up on this.  _______________________________________________________  If you are age 71 or older, your body mass index should be between 23-30. Your Body mass index is 19.4 kg/m. If this is out of the aforementioned range listed, please consider follow up with your Primary Care Provider.  If you are age 74 or younger, your body mass index should be between 19-25. Your Body mass index is 19.4 kg/m. If this is out of the aformentioned range listed, please consider follow up with your Primary Care Provider.   ________________________________________________________  The [facility] providers would like to encourage you to use [facility] to communicate with providers for non-urgent requests or questions.  Due to long hold times on the telephone, sending your provider a message by [facility] may be a faster and more efficient way to get a response.  Please allow 48 business hours for a response.  Please remember that this is for non-urgent requests.  _______________________________________________________  Continue current medications  We have sent the following medications to your pharmacy for you to pick up at your convenience: Culivate  You have been scheduled for an appointment with Dr. Chales Abrahams on 02-07-24 at 850am . Please arrive 10 minutes early for your appointment.  How to Take a [employer] with epson salt 2 times a day for 7 days  A sitz bath is a warm water bath that may be used to care for your rectum, genital area, or the area between your rectum and genitals (perineum). In a sitz bath, the water only comes up to your hips and covers your buttocks. A sitz bath may be done in a bathtub or with a portable sitz bath that fits over the toilet. Your health care provider may recommend a sitz bath to help: Relieve pain and discomfort  after delivering a baby. Relieve pain and itching from hemorrhoids or anal fissures. Relieve pain after certain surgeries. Relax muscles that are sore or tight. How to take a sitz bath Take 2-4 sitz baths a day, or as many as told by your health care provider. Bathtub sitz bath To take a sitz bath in a bathtub: Partially fill a bathtub with warm water. The water should be deep enough to cover your hips and buttocks when you are sitting in the bathtub. Follow your health care provider's instructions if you are told to put medicine in the water. Sit in the water. Open the bathtub drain a little, and leave it open during your bath. Turn on the warm water again, enough to replace the water that is draining out. Keep the water running throughout your bath. This helps keep the water at the right level and temperature. Soak in the water for 15-20 minutes, or as long as told by your health care provider. When you are done, be careful when you stand up. You may feel dizzy. After the sitz bath, pat yourself dry. Do not rub your skin to dry it.  Over-the-toilet sitz bath To take a sitz bath with an over-the-toilet basin: Follow the manufacturer's instructions. Fill the basin with warm water. Follow your health care provider's instructions if you were told to put medicine in the water. Sit on the seat. Make sure the water covers your buttocks and perineum. Soak in the water for 15-20 minutes, or as long as told by your health care provider. After the sitz bath, pat yourself dry. Do not  rub your skin to dry it. Clean and dry the basin between uses. Discard the basin if it cracks, or according to the manufacturer's instructions.  Contact a health care provider if: Your pain or itching gets worse. Stop doing sitz baths if your symptoms get worse. You have new symptoms. Stop doing sitz baths until you talk with your health care provider. Summary A sitz bath is a warm water bath in which the water only  comes up to your hips and covers your buttocks. Your health care provider may recommend a sitz bath to help relieve pain and discomfort after delivering a baby, relieve pain and itching from hemorrhoids or anal fissures, relieve pain after certain surgeries, or help to relax muscles that are sore or tight. Take 2-4 sitz baths a day, or as many as told by your health care provider. Soak in the water for 15-20 minutes. Stop doing sitz baths if your symptoms get worse. This information is not intended to replace advice given to you by your health care provider. Make sure you discuss any questions you have with your health care provider. Document Revised: 01/19/2022 Document Reviewed: 01/19/2022 Elsevier Patient Education  2024 [employer].

## 2024-01-06 NOTE — Progress Notes (Signed)
 Chief Complaint: FU  Referring Provider:  Galvin Proffer, MD      ASSESSMENT AND PLAN;   #1. Metastatic breast ca (to liver, bone) on ENHERTU , now with likely malignant ascites.  However, last 2 cytologies have been neg for malignancy.    #2. Underlying liver cirrhosis with pHTN.   #3. Int hoids.   Plan: -Lasix 20mg  po every day/aldactone 50mg  po every day #90, 3RF -Low salt diet -fluticasone cream 0.05% generic 30g 1 bid PR x 10 days, 2 refills -Sitz baths with epson salt BID x 7 days. -FU in 4 weeks. -She is being closely followed by oncology. -Daughter understands that she does not have good prognosis.    HPI:    Wendy Walsh is a 73 y.o. female  With metastatic breast cancer (being followed by Dr. Welton Flakes (oncology) FU  Frequency of paracenteses has decreased Required 1 paracenteses few days ago. Has been compliant with low-sodium diet and medicines  SAAG was consistent with portal hypertension Previous cytology has been negative  Unfortunately, her liver tests are increasing.  She is to see Dr. Welton Flakes next week.  Imaging is planned. Most recent labs from 12/19/2023-alkaline phosphatase 416, AST 64, ALT 34, TB 1.1, albumin 2.6.  Potassium 4.7.  From previous notes: Had GS pancreatitis, resolved on its own.  She was offered ERCP at [facility].  Patient and family decided to hold off d/t multiple comorbidities   Wt Readings from Last 3 Encounters:  01/06/24 113 lb (51.3 kg)  01/01/24 110 lb (49.9 kg)  12/14/23 116 lb 6 oz (52.8 kg)     PET CT 04/2019/MRI [facility] 05/2023 was consistent with several liver metastatic lesions and coarse nodular cirrhotic liver without portal hypertension.  She underwent liver biopsy which confirmed metastatic breast cancer.  The etiology of liver cirrhosis is not clear-thought to be due to [facility].  No alcohol Negative alpha 1 antitrypsin, ceruloplasmin, ASMA, ANA, normal PT/INR, negative iron studies for hemochromatosis. AFP was  borderline elevated at 6.3.  Elevated alk phos-expected  Recent GI workup:  MRI with and without contrast 11/03/2023 1. Assessment of the liver is degraded by motion artifact on delayed  postcontrast imaging. As on the previous MRI, there are innumerable  small hypoenhancing nodules scattered throughout the liver  parenchyma. 1 of these in the subcapsular lateral right liver  appears progressive in the interval. While findings remain  nonspecific given the advanced background cirrhosis, metastatic  disease is not excluded.  2. Diffuse heterogeneous marrow enhancement in the visualized  thoracolumbar spine is compatible with metastatic disease.  3. Moderate volume ascites.  4. Trace bilateral pleural effusions.  5. Cholelithiasis.   MRI with and without contrast MRI liver with and without contrast 05/2023 1. Coarse, nodular cirrhotic contour of the liver.  2. Very heterogeneous parenchymal contrast enhancement pattern, with  a background of underlying irregular, reticular T2 hyperintensity  throughout. On portal venous phase of contrast, there are some  particularly conspicuous focally hypoenhancing lesions, generally  with a background of innumerable similar, smaller lesions.  3. Generally suspect that this appearance indeed reflects some  combination of chronic liver disease, fibrosis and diffuse  metastatic disease despite lack of abnormal FDG avidity on prior  PET-CT. Consider tissue sampling to definitively establish the  presence of liver metastatic disease if significant for treatment  decisions.  4. Diffuse, heterogeneously enhancing osseous metastatic disease  throughout the included vertebral bodies.  5. Gallbladder sludge without discrete gallstones.   PET 04/13/2023  1. No signs of tracer avid metastatic disease.  2. Innumerable small, predominantly sclerotic lesions are identified  throughout the visualized axial and appendicular skeleton. These are  favored to  represent treated bone metastases.  3. No abnormal foci of increased uptake within the liver, above  background liver activity to suggest tracer avid liver metastases.  4. Nodular contours of the liver suggestive of cirrhosis.  5.  Aortic Atherosclerosis (ICD10-I70.0).    Liver biopsy 06/21/2023: Consistent with metastatic breast CA  Colonoscopy 04/2018 -Small colonic polyp s/p polypectomy. Bx: Hyperplastic -Small internal hemorrhoids. -rpt 10 yrs- no need to rpt d/t age   EGD 04/2018 Mild gastritis Otherwise normal EGD.  Past Medical History:  Diagnosis Date   Breast cancer (HCC)    Chronic constipation    Cirrhosis (HCC)    Essential hypertension 09/07/2017   History of kidney stones    approx. 2008-- passed   History of sepsis    03-30-2017  urosepsis due to stone obstruction   Hyperlipidemia 09/07/2017   Hypertension    Left ureteral stone    Wears glasses     Past Surgical History:  Procedure Laterality Date   CYSTOSCOPY WITH STENT PLACEMENT Left 03/30/2017   Procedure: CYSTOSCOPY WITH STENT PLACEMENT AND RETROGRADE;  Surgeon: Jerilee Field, MD;  Location: [facility];  Service: Urology;  Laterality: Left;   CYSTOSCOPY/URETEROSCOPY/HOLMIUM LASER/STENT PLACEMENT Left 04/19/2017   Procedure: CYSTOSCOPY LEFT URETEROSCOPY/STENT EXCHANGE;  Surgeon: Jerilee Field, MD;  Location: [facility];  Service: Urology;  Laterality: Left;   MASTECTOMY Right 2022   PARTIAL HYSTERECTOMY  1990s   approach unknown (vaginal? or abdominal?)   TRANSTHORACIC ECHOCARDIOGRAM  04/01/2017   ef 55-60%,  grade 1 diastolic dysfunction/  trivial TR    Family History  Problem Relation Age of Onset   Colon cancer Neg Hx    Esophageal cancer Neg Hx    Stomach cancer Neg Hx     Social History   Tobacco Use   Smoking status: Never   Smokeless tobacco: Never  Vaping Use   Vaping status: Never Used  Substance Use Topics   Alcohol use: No   Drug use: No    Current  Outpatient Medications  Medication Sig Dispense Refill   B Complex-Folic Acid (B COMPLEX VITAMINS, W/ FA,) CAPS Take 1 capsule by mouth daily.     Cholecalciferol (VITAMIN D3) 5000 units TABS Take 5,000 Units by mouth daily.     fam-trastuzumab deruxtecan-nxki (ENHERTU) 100 MG SOLR Inject into the vein once.     Fulvestrant (FASLODEX IM) Inject into the muscle every 30 (thirty) days.     furosemide (LASIX) 20 MG tablet Take 1 tablet (20 mg total) by mouth daily. 90 tablet 3   hydrocortisone (ANUSOL-HC) 25 MG suppository Place 1 suppository (25 mg total) rectally 2 (two) times daily. 12 suppository 0   omeprazole (PRILOSEC) 20 MG capsule Take 1 capsule (20 mg total) by mouth daily. (Patient taking differently: Take 20 mg by mouth daily as needed.) 30 capsule 0   ondansetron (ZOFRAN-ODT) 8 MG disintegrating tablet Dissolve 1 tablet (8 mg total) by mouth every 8 hours as needed (chemo induced nausea/vomiting)     oxyCODONE (OXY IR/ROXICODONE) 5 MG immediate release tablet Take 5 mg by mouth every 6 (six) hours as needed.     prochlorperazine (COMPAZINE) 10 MG tablet Take 1 tablet (10 mg total) by mouth every 6 (six) hours as needed (chemo induced nausea/vomiting)     spironolactone (ALDACTONE) 25 MG  tablet Take 25 mg by mouth 2 (two) times daily.     spironolactone (ALDACTONE) 50 MG tablet Take 1 tablet (50 mg total) by mouth daily. 90 tablet 3   No current facility-administered medications for this visit.    No Known Allergies  Review of Systems:  neg     Physical Exam:    BP 118/62   Pulse 65   Ht 5\' 4"  (1.626 m)   Wt 113 lb (51.3 kg)   BMI 19.40 kg/m  Wt Readings from Last 3 Encounters:  01/06/24 113 lb (51.3 kg)  01/01/24 110 lb (49.9 kg)  12/14/23 116 lb 6 oz (52.8 kg)   Constitutional:  Well-developed, in no acute distress. Psychiatric: Normal mood and affect. Behavior is normal. HEENT: Pupils normal.  Conjunctivae are normal. No scleral icterus. Abdominal: Soft,  nondistended at this time. Nontender. Bowel sounds active throughout. There are no masses palpable. No hepatomegaly. Rectal: Deferred Neurological: Alert and oriented to person place and time. Skin: Skin is warm and dry. No rashes noted.  Data Reviewed: I have personally reviewed following labs and imaging studies     Latest Ref Rng & Units 06/09/2023   10:59 AM 12/10/2020   11:01 PM 10/28/2017    2:42 PM  CMP  Glucose 70 - 99 mg/dL 161  096  045   BUN 6 - 23 mg/dL 13  9  13    Creatinine 0.40 - 1.20 mg/dL 4.09  8.11  9.14   Sodium 135 - 145 mEq/L 140  144  141   Potassium 3.5 - 5.1 mEq/L 4.2  3.6  3.6   Chloride 96 - 112 mEq/L 106  109  107   CO2 19 - 32 mEq/L 26  23  25    Calcium 8.4 - 10.5 mg/dL 9.5  9.5  9.4   Total Protein 6.0 - 8.3 g/dL 8.2   8.2   Total Bilirubin 0.2 - 1.2 mg/dL 1.1   1.9   Alkaline Phos 39 - 117 U/L 215   65   AST 0 - 37 U/L 83   31   ALT 0 - 35 U/L 50   29       Edman Circle, MD 01/06/2024, 10:54 AM  Cc: Galvin Proffer, MD

## 2024-01-21 ENCOUNTER — Encounter: Payer: Self-pay | Admitting: Gastroenterology

## 2024-01-31 DEATH — deceased

## 2024-02-07 ENCOUNTER — Ambulatory Visit: Admitting: Gastroenterology
